# Patient Record
Sex: Male | Born: 1940 | Race: White | Hispanic: No | Marital: Married | Smoking: Former smoker
Health system: Southern US, Community
[De-identification: ages and names within clinical notes are randomized; demographics above are authoritative.]

## PROBLEM LIST (undated history)

## (undated) DIAGNOSIS — Z8673 Personal history of transient ischemic attack (TIA), and cerebral infarction without residual deficits: Secondary | ICD-10-CM

## (undated) DIAGNOSIS — E785 Hyperlipidemia, unspecified: Secondary | ICD-10-CM

## (undated) DIAGNOSIS — G4733 Obstructive sleep apnea (adult) (pediatric): Secondary | ICD-10-CM

## (undated) DIAGNOSIS — I10 Essential (primary) hypertension: Secondary | ICD-10-CM

## (undated) DIAGNOSIS — R0789 Other chest pain: Secondary | ICD-10-CM

## (undated) HISTORY — PX: THROAT SURGERY: SHX803

## (undated) HISTORY — PX: BACK SURGERY: SHX140

## (undated) HISTORY — DX: Personal history of transient ischemic attack (TIA), and cerebral infarction without residual deficits: Z86.73

## (undated) HISTORY — PX: SHOULDER SURGERY: SHX246

## (undated) HISTORY — PX: EYE SURGERY: SHX253

## (undated) HISTORY — PX: CARDIOVASCULAR STRESS TEST: SHX262

## (undated) HISTORY — DX: Essential (primary) hypertension: I10

## (undated) HISTORY — DX: Obstructive sleep apnea (adult) (pediatric): G47.33

## (undated) HISTORY — DX: Other chest pain: R07.89

## (undated) HISTORY — DX: Hyperlipidemia, unspecified: E78.5

---

## 1999-01-18 ENCOUNTER — Ambulatory Visit (HOSPITAL_COMMUNITY): Admission: RE | Admit: 1999-01-18 | Discharge: 1999-01-18 | Payer: Self-pay | Admitting: Specialist

## 1999-01-18 ENCOUNTER — Encounter: Payer: Self-pay | Admitting: Specialist

## 1999-02-15 ENCOUNTER — Encounter: Payer: Self-pay | Admitting: Specialist

## 1999-02-15 ENCOUNTER — Ambulatory Visit (HOSPITAL_COMMUNITY): Admission: RE | Admit: 1999-02-15 | Discharge: 1999-02-15 | Payer: Self-pay | Admitting: Specialist

## 1999-03-01 ENCOUNTER — Ambulatory Visit (HOSPITAL_COMMUNITY): Admission: AD | Admit: 1999-03-01 | Discharge: 1999-03-01 | Payer: Self-pay | Admitting: Specialist

## 1999-03-01 ENCOUNTER — Encounter: Payer: Self-pay | Admitting: Specialist

## 1999-03-15 ENCOUNTER — Ambulatory Visit (HOSPITAL_COMMUNITY): Admission: AD | Admit: 1999-03-15 | Discharge: 1999-03-15 | Payer: Self-pay | Admitting: Specialist

## 1999-03-15 ENCOUNTER — Encounter: Payer: Self-pay | Admitting: Specialist

## 1999-09-21 ENCOUNTER — Encounter: Payer: Self-pay | Admitting: Specialist

## 1999-09-28 ENCOUNTER — Encounter: Payer: Self-pay | Admitting: Specialist

## 1999-09-28 ENCOUNTER — Encounter (INDEPENDENT_AMBULATORY_CARE_PROVIDER_SITE_OTHER): Payer: Self-pay | Admitting: *Deleted

## 1999-09-28 ENCOUNTER — Inpatient Hospital Stay (HOSPITAL_COMMUNITY): Admission: RE | Admit: 1999-09-28 | Discharge: 1999-09-30 | Payer: Self-pay | Admitting: Specialist

## 2000-08-17 ENCOUNTER — Ambulatory Visit (HOSPITAL_BASED_OUTPATIENT_CLINIC_OR_DEPARTMENT_OTHER): Admission: RE | Admit: 2000-08-17 | Discharge: 2000-08-17 | Payer: Self-pay | Admitting: Internal Medicine

## 2002-01-21 ENCOUNTER — Ambulatory Visit (HOSPITAL_COMMUNITY): Admission: RE | Admit: 2002-01-21 | Discharge: 2002-01-21 | Payer: Self-pay | Admitting: Specialist

## 2002-01-23 ENCOUNTER — Encounter: Payer: Self-pay | Admitting: Specialist

## 2002-01-23 ENCOUNTER — Encounter: Admission: RE | Admit: 2002-01-23 | Discharge: 2002-01-23 | Payer: Self-pay | Admitting: Specialist

## 2004-04-21 ENCOUNTER — Encounter: Admission: RE | Admit: 2004-04-21 | Discharge: 2004-04-21 | Payer: Self-pay | Admitting: Internal Medicine

## 2004-07-14 ENCOUNTER — Ambulatory Visit (HOSPITAL_COMMUNITY): Admission: RE | Admit: 2004-07-14 | Discharge: 2004-07-14 | Payer: Self-pay | Admitting: Neurological Surgery

## 2004-08-09 ENCOUNTER — Inpatient Hospital Stay (HOSPITAL_COMMUNITY): Admission: RE | Admit: 2004-08-09 | Discharge: 2004-08-09 | Payer: Self-pay | Admitting: Neurological Surgery

## 2004-12-17 ENCOUNTER — Emergency Department (HOSPITAL_COMMUNITY): Admission: EM | Admit: 2004-12-17 | Discharge: 2004-12-17 | Payer: Self-pay | Admitting: Emergency Medicine

## 2005-05-15 ENCOUNTER — Observation Stay (HOSPITAL_COMMUNITY): Admission: EM | Admit: 2005-05-15 | Discharge: 2005-05-17 | Payer: Self-pay | Admitting: Emergency Medicine

## 2005-08-29 ENCOUNTER — Inpatient Hospital Stay (HOSPITAL_COMMUNITY): Admission: EM | Admit: 2005-08-29 | Discharge: 2005-09-01 | Payer: Self-pay | Admitting: Emergency Medicine

## 2005-08-30 ENCOUNTER — Encounter (INDEPENDENT_AMBULATORY_CARE_PROVIDER_SITE_OTHER): Payer: Self-pay | Admitting: Cardiology

## 2005-12-05 ENCOUNTER — Ambulatory Visit (HOSPITAL_COMMUNITY): Admission: RE | Admit: 2005-12-05 | Discharge: 2005-12-05 | Payer: Self-pay | Admitting: Internal Medicine

## 2006-12-28 ENCOUNTER — Encounter: Admission: RE | Admit: 2006-12-28 | Discharge: 2006-12-28 | Payer: Self-pay | Admitting: Internal Medicine

## 2008-04-10 HISTORY — PX: US ECHOCARDIOGRAPHY: HXRAD669

## 2009-06-08 IMAGING — RF DG ESOPHAGUS
13 of 14 series · 20 of 21 positions shown · non-contrast
Comparison: Plain film chest 05/15/2005. Abdomen ultrasound 12/05/2005.

DOUBLE-CONTRAST BARIUM  ESOPHAGRAM.

CLINICAL DATA: Epigastric pain.
TECHNIQUE: Standard double-contrast barium swallow was performed.

[Series 1: run · 1 of 1 slices shown (1 of 13)]
[im 1/1]
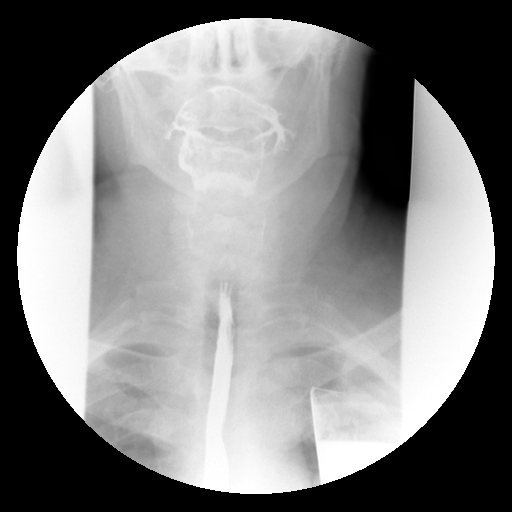

[Series 2: run · 6 of 6 slices shown (2 of 13)]
[im 1/6]
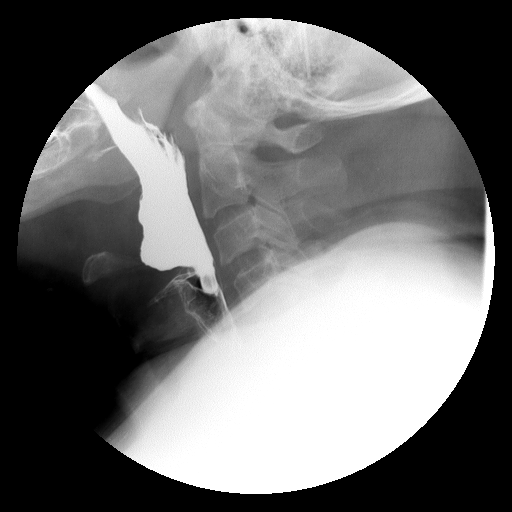
[im 2/6]
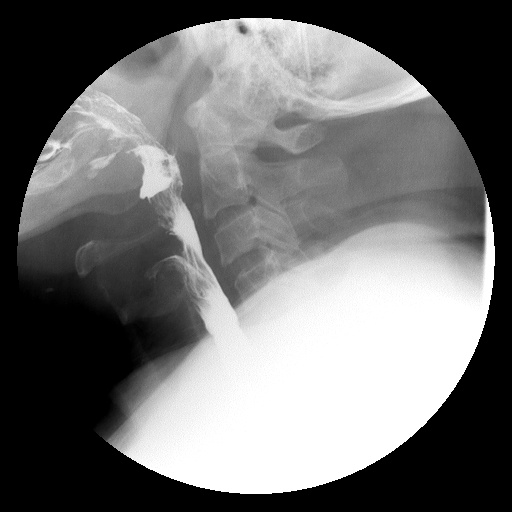
[im 3/6]
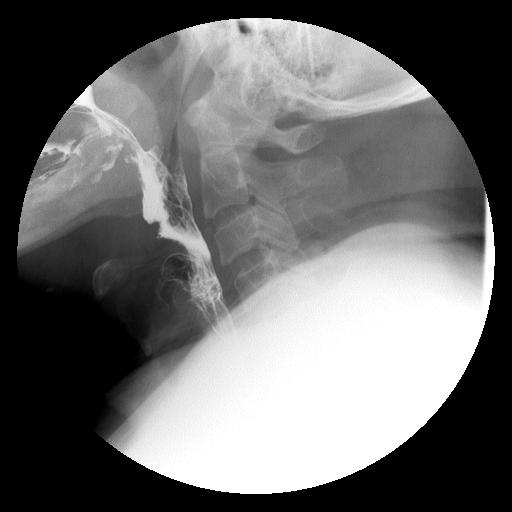
[im 4/6]
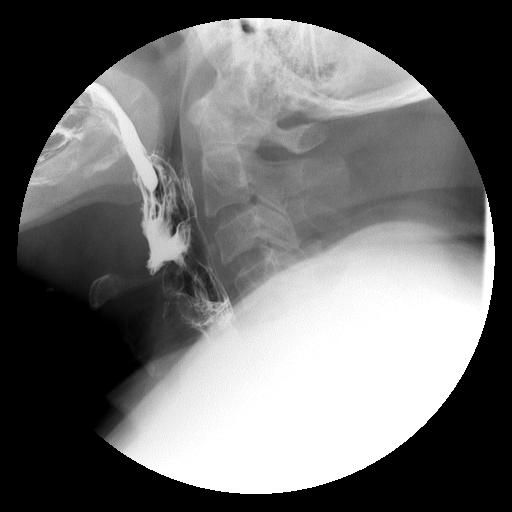
[im 5/6]
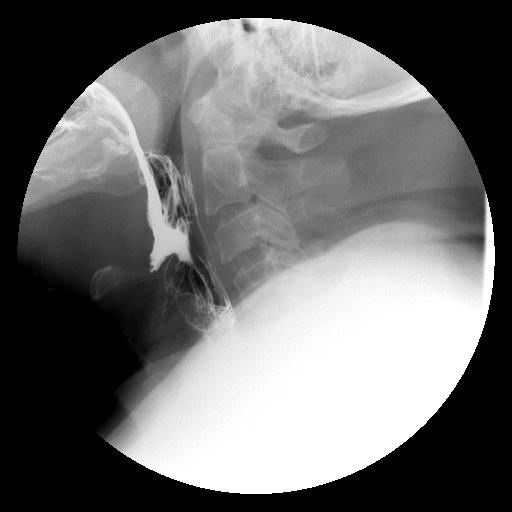
[im 6/6]
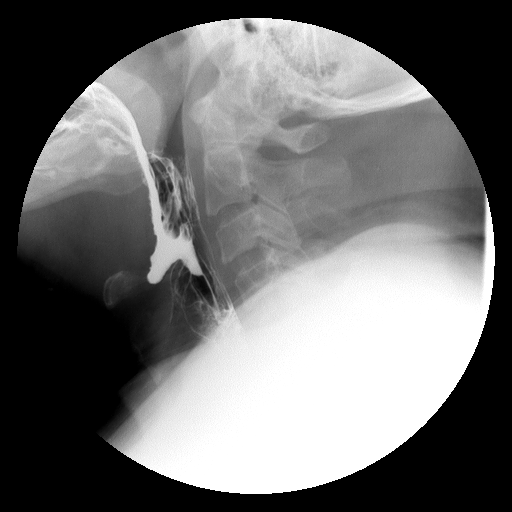

[Series 3: run · 1 of 1 slices shown (3 of 13)]
[im 1/1]
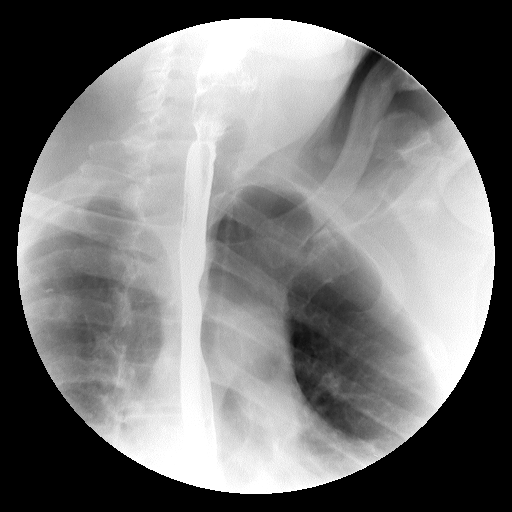

[Series 4: run · 1 of 1 slices shown (4 of 13)]
[im 1/1]
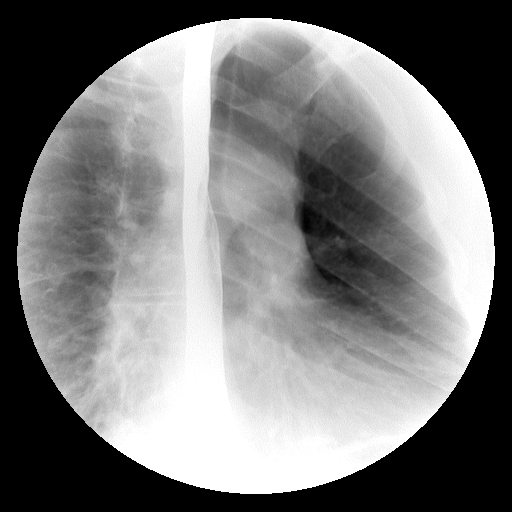

[Series 5: run · 1 of 1 slices shown (5 of 13)]
[im 1/1]
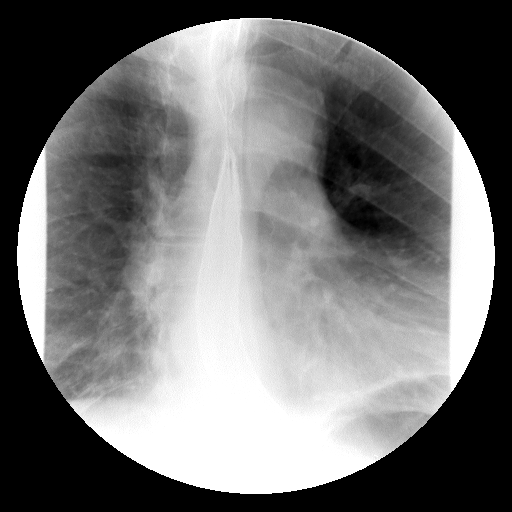

[Series 7: run · 1 of 1 slices shown (6 of 13)]
[im 1/1]
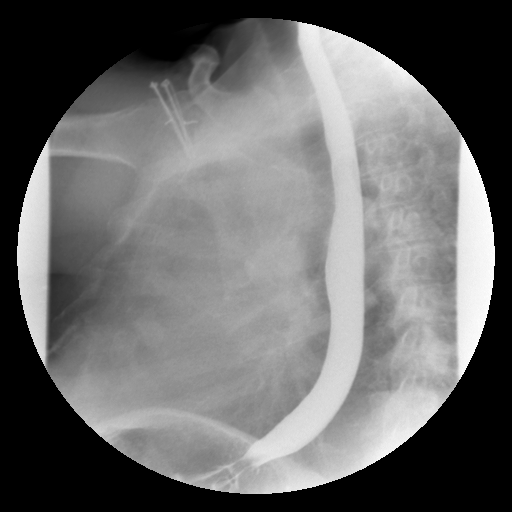

[Series 8: run · 1 of 1 slices shown (7 of 13)]
[im 1/1]
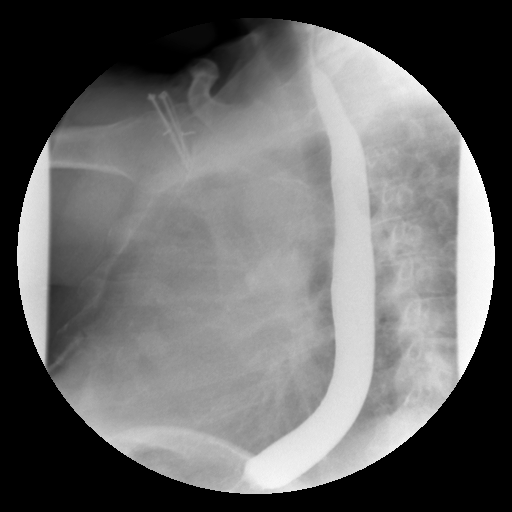

[Series 9: run · 1 of 1 slices shown (8 of 13)]
[im 1/1]
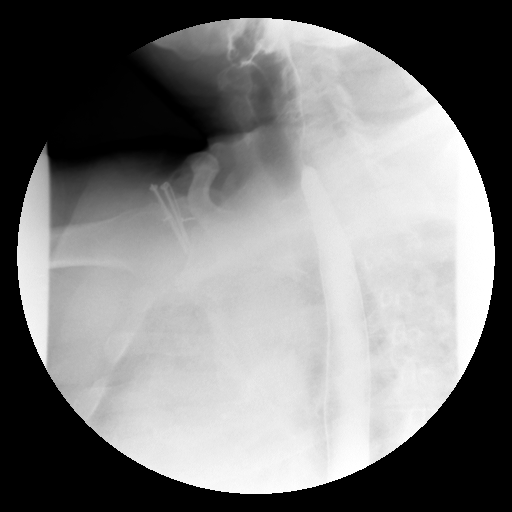

[Series 10: run · 1 of 1 slices shown (9 of 13)]
[im 1/1]
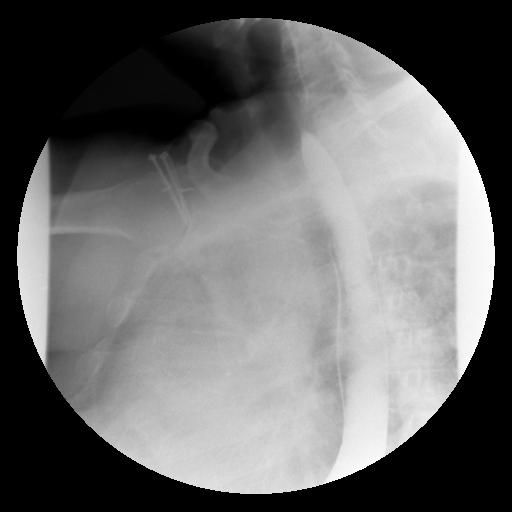

[Series 11: run · 1 of 1 slices shown (10 of 13)]
[im 1/1]
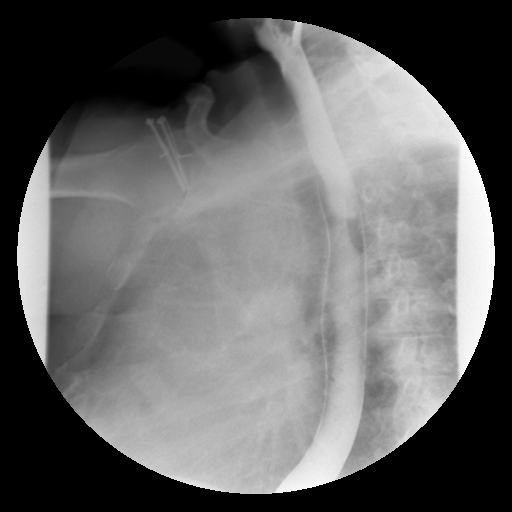

[Series 12: run · 1 of 1 slices shown (11 of 13)]
[im 1/1]
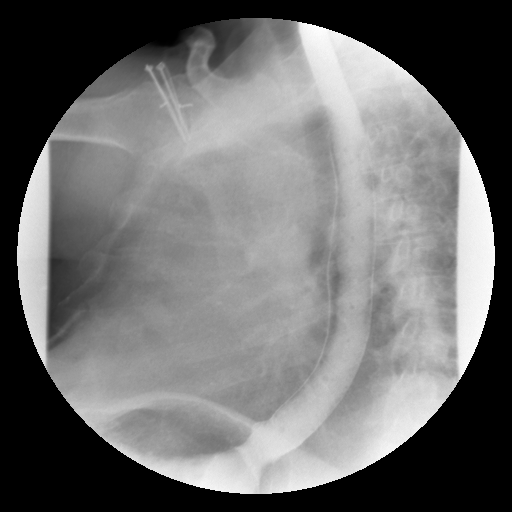

[Series 13: run · 3 of 3 slices shown (12 of 13)]
[im 1/3]
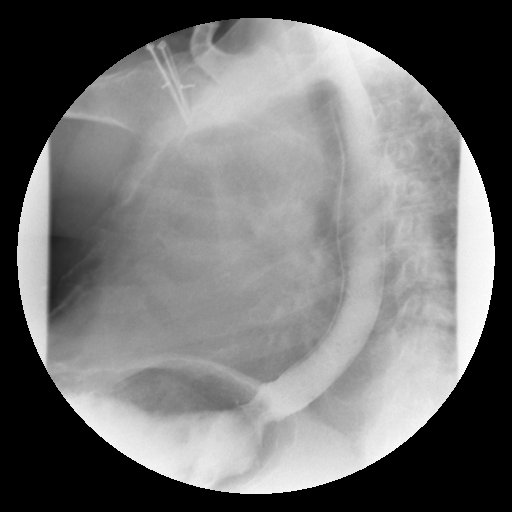
[im 2/3]
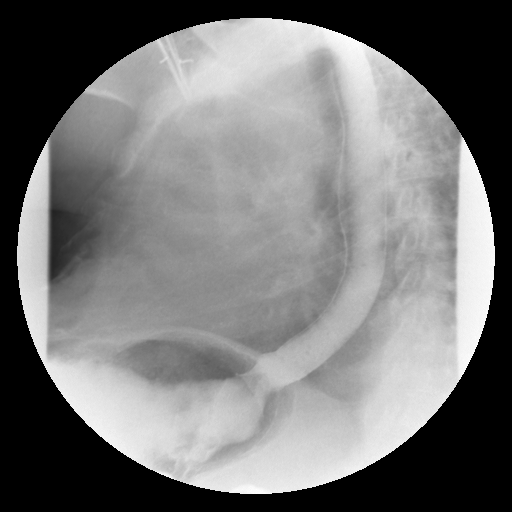
[im 3/3]
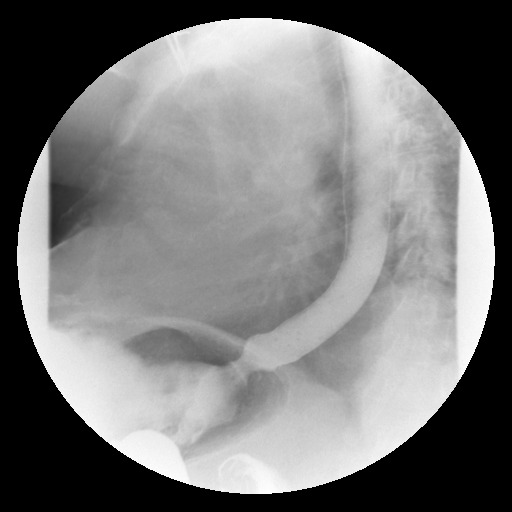

[Series 14: run · 1 of 1 slices shown (13 of 13)]
[im 1/1]
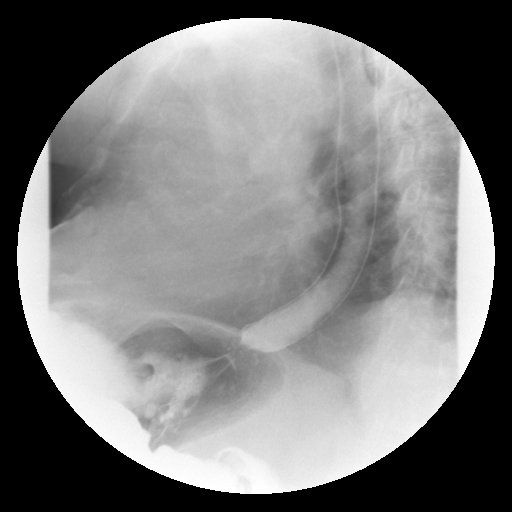

[20 of 21 positions shown; findings below may reference images not displayed]

FINDINGS: Hypopharyngeal portion of the exam demonstrates no significant findings.

Double-contrast evaluation of the esophagus demonstrates no mucosal abnormality.

Evaluation of primary peristalsis is normal.

Single contrast evaluation of the esophagus demonstrates no stricture or
significant narrowing.
IMPRESSION: 1. Normal esophagram as described.

## 2010-01-20 ENCOUNTER — Telehealth (INDEPENDENT_AMBULATORY_CARE_PROVIDER_SITE_OTHER): Payer: Self-pay | Admitting: *Deleted

## 2010-01-21 ENCOUNTER — Encounter (HOSPITAL_COMMUNITY): Admission: RE | Admit: 2010-01-21 | Discharge: 2010-03-11 | Payer: Self-pay | Admitting: Cardiology

## 2010-01-21 ENCOUNTER — Ambulatory Visit: Payer: Self-pay | Admitting: Internal Medicine

## 2010-01-21 ENCOUNTER — Encounter: Payer: Self-pay | Admitting: Cardiovascular Disease

## 2010-01-21 ENCOUNTER — Encounter: Payer: Self-pay | Admitting: Internal Medicine

## 2010-01-21 ENCOUNTER — Ambulatory Visit: Payer: Self-pay

## 2010-01-21 DIAGNOSIS — G459 Transient cerebral ischemic attack, unspecified: Secondary | ICD-10-CM | POA: Insufficient documentation

## 2010-01-22 ENCOUNTER — Encounter: Payer: Self-pay | Admitting: Cardiovascular Disease

## 2010-05-20 ENCOUNTER — Ambulatory Visit: Payer: Self-pay | Admitting: Vascular Surgery

## 2010-07-11 ENCOUNTER — Encounter (HOSPITAL_BASED_OUTPATIENT_CLINIC_OR_DEPARTMENT_OTHER): Payer: Self-pay | Admitting: Internal Medicine

## 2010-07-20 NOTE — Progress Notes (Signed)
Summary: Nuclear Pre Procedure  Phone Note Outgoing Call Call back at Home Phone [phone]   Call placed by: Rea [employer], CMA,  January 20, 2010 3:46 PM Call placed to: Patient Summary of Call: Reviewed information on Myoview Information Sheet (see scanned document for further details).  Spoke with patient.      Nuclear Med Background Indications for Stress Test: Evaluation for Ischemia   History: Echo, Myocardial Perfusion Study  History Comments: 3/07 Echo EF 75%, 7/07 MPS EF 62% No ischemia  Symptoms: Chest Pain, DOE, Fatigue    Nuclear Pre-Procedure Cardiac Risk Factors: CVA, Family History - CAD, Hypertension, Lipids, TIA

## 2010-07-20 NOTE — Assessment & Plan Note (Signed)
Summary: Cardiology Nuclear Testing  Nuclear Med Background Indications for Stress Test: Evaluation for Ischemia   History: Defibrillator, Echo, Myocardial Perfusion Study  History Comments: 3/07 Echo EF 75%, 7/07 MPS EF 62% No ischemia  Symptoms: Chest Pain, DOE, Fatigue    Nuclear Pre-Procedure Cardiac Risk Factors: CVA, Family History - CAD, Hypertension, Lipids, TIA Caffeine/Decaff Intake: None NPO After: 9:30 PM Lungs: clear IV 0.9% NS with Angio Cath: 22g     IV Site: (R) Wrist IV Started by: Irean Hong RN Chest Size (in) 44     Height (in): 65 Weight (lb): 199 BMI: 33.24 Tech Comments: The patient is claustrophobic, and took valium 10mg   by mouth  at 6:30 am today per orders by Dr. Deborah Chalk.  Nuclear Med Study 1 or 2 day study:  1 day     Stress Test Type:  Eugenie Birks Reading MD:  Dietrich Pates, MD     Referring MD:  S.Tennant Resting Radionuclide:  Technetium 71m Tetrofosmin     Resting Radionuclide Dose:  11.0 mCi  Stress Radionuclide:  Technetium 22m Tetrofosmin     Stress Radionuclide Dose:  33.0 mCi   Stress Protocol   Lexiscan: 0.4 mg        Nuclear Technologist:  Domenic Polite CNMT  Rest Procedure  Myocardial perfusion imaging was performed at rest 45 minutes following the intravenous administration of Myoview Technetium 20m Tetrofosmin.  Stress Procedure  The patient received IV Lexiscan 0.4 mg over 15-seconds.  Myoview injected at 30-seconds.  There were no significant changes with infusion.  Quantitative spect images were obtained after a 45 minute delay.  QPS Raw Data Images:  Images were motion corrected.  SOf t tissue (diaphragm, bowel activity) underlie heart. Stress Images:  There is normal uptake in all areas. Rest Images:  Normal homogeneous uptake in all areas of the myocardium. Subtraction (SDS):  No evidence of ischemia. Transient Ischemic Dilatation:  .96  (Normal <1.22)  Lung/Heart Ratio:  .34  (Normal <0.45)  Quantitative Gated Spect  Images QGS EDV:  70 ml QGS ESV:  23 ml QGS EF:  66 %   Overall Impression  Exercise Capacity: Lexiscan protocol. BP Response: Normal blood pressure response. Clinical Symptoms: No chest pain ECG Impression: No significant ST segment change suggestive of ischemia. Overall Impression: Normal stress nuclear study.

## 2010-07-20 NOTE — Miscellaneous (Signed)
Summary: Orders Update  Clinical Lists Changes  Problems: Added new problem of TIA (ICD-435.9) Orders: Added new Test order of Carotid Duplex (Carotid Duplex) - Signed

## 2010-07-22 ENCOUNTER — Encounter (INDEPENDENT_AMBULATORY_CARE_PROVIDER_SITE_OTHER): Payer: Self-pay | Admitting: *Deleted

## 2010-07-28 NOTE — Letter (Signed)
Summary: New Patient letter  [facility]  [address]   [place], [place] [address]   Phone: [phone]  Fax: [phone]       07/22/2010 MRN: [id number]  Sean Quinn [address] [place], [place]  [address]  Dear Mr. BASKETT,  Welcome to the Gastroenterology Division at [facility].    You are scheduled to see Dr.  Russella Dar on 08/16/2010 at 3:45 on the 3rd floor at [facility], [address]  We ask that you try to arrive at our office 15 minutes prior to your appointment time to allow for check-in.  We would like you to complete the enclosed self-administered evaluation form prior to your visit and bring it with you on the day of your appointment.  We will review it with you.  Also, please bring a complete list of all your medications or, if you prefer, bring the medication bottles and we will list them.  Please bring your insurance card so that we may make a copy of it.  If your insurance requires a referral to see a specialist, please bring your referral form from your primary care physician.  Co-payments are due at the time of your visit and may be paid by cash, check or credit card.     Your office visit will consist of a consult with your physician (includes a physical exam), any laboratory testing he/she may order, scheduling of any necessary diagnostic testing (e.g. x-ray, ultrasound, CT-scan), and scheduling of a procedure (e.g. Endoscopy, Colonoscopy) if required.  Please allow enough time on your schedule to allow for any/all of these possibilities.    If you cannot keep your appointment, please call [phone] to cancel or reschedule prior to your appointment date.  This allows [place] the opportunity to schedule an appointment for another patient in need of care.  If you do not cancel or reschedule by 5 p.m. the business day prior to your appointment date, you will be charged a $50.00 late cancellation/no-show fee.    Thank you for choosing [facility] for your medical needs.  We appreciate the opportunity to care for you.  Please visit [place] at our website  to learn more about our practice.                     Sincerely,                                                             The Gastroenterology Division

## 2010-08-16 ENCOUNTER — Ambulatory Visit: Payer: Self-pay | Admitting: Gastroenterology

## 2010-11-02 NOTE — Procedures (Signed)
CAROTID DUPLEX EXAM   INDICATION:  Carotid artery disease.   HISTORY:  Diabetes:  No  Cardiac:  No  Hypertension:  Hypertension  Smoking:  No  Previous Surgery:  No  CV History:  Dizziness, history of TIA with slurred speech  Amaurosis Fugax No, Paresthesias No, Hemiparesis No                                       RIGHT             LEFT  Brachial systolic pressure:         132               129  Brachial Doppler waveforms:         Normal            Normal  Vertebral direction of flow:        Antegrade         Antegrade  DUPLEX VELOCITIES (cm/sec)  CCA peak systolic                   108               113  ECA peak systolic                   125               72  ICA peak systolic                   84                53  ICA end diastolic                   25                18  PLAQUE MORPHOLOGY:                  Heterogeneous     Homogeneous  PLAQUE AMOUNT:                      Minimal           Minimal  PLAQUE LOCATION:                    CCA bifurcation   Bifurcation   IMPRESSION:  1. Right ICA velocities suggestive of a 1% to 39% stenosis.  2. Left ICA velocities suggestive of a 1% to 39% stenosis.          ___________________________________________  Janetta Hora Darrick Penna, MD   EM/MEDQ  D:  05/20/2010  T:  05/20/2010  Job:  [id number]

## 2010-11-02 NOTE — Assessment & Plan Note (Signed)
OFFICE VISIT   Sean Quinn, Sean Quinn  DOB:  Oct 25, 1940                                       05/20/2010  ZOXWR#:[id number]   CHIEF COMPLAINT:  Right leg pain.   HISTORY OF PRESENT ILLNESS:  The patient is 70 year old male referred by  Dr. Eloise Quinn for evaluation of lower extremity pain.  The patient states  that he has had pain in his right leg that occurs immediately upon  standing.  He states that it has been present for approximately 2 years  but worse over the last 6 months.  He states it starts hurting after  taking 1-2 steps.  Of note, he has had multiple back operations.  He has  also had a prior stroke.  There was some thinking from Dr. Eloise Quinn that  this may be neurogenic in nature.   The patient denies atherosclerotic risk factors of diabetes,  hypertension or elevated cholesterol.   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   SOCIAL HISTORY:  He is married.  He has two children.  He is retired.  He is a former smoker but quit in 1977.  He drinks 4-5 beers on some  days but some days does not drink at all.  He does not currently smoke.   FAMILY HISTORY:  Denies history of coronary artery disease or peripheral  arterial occlusive disease.   REVIEW OF SYSTEMS:  Full 12 point review of systems was performed on the  patient today.  Please see intake referral form for details regarding  this.   MEDICATIONS:  Include methazolamide, aspirin, lactulose, furosemide,  tramadol, ipratropium, omega 3 Fish Oil, vitamin D3, vitamin B12,  Writer.   ALLERGIES:  He has no known drug allergies.   PHYSICAL EXAM:  Vital signs:  Blood pressure is 129/81 in the left arm,  heart rate is 103 and regular, respirations 20.  HEENT:  Unremarkable.  Neck:  Has 2+ carotid pulses without bruit.  Chest:  Clear to  auscultation.  Cardiac:  Regular  rate and rhythm without murmur.  Abdomen:  Obese, soft, nontender, nondistended.  No masses.  Extremities:  He has 2+ femoral pulses bilaterally.  He has a 2+ left  popliteal pulse.  He has a 2+ left posterior tibial pulse.  He has  absent popliteal and pedal pulses on the right side.  Feet are pink,  warm and well-perfused bilaterally.  He has no ulcerations on the feet.  Skin:  Has no open ulcers or rashes elsewhere.  Neurologic:  Shows  symmetric upper extremity and lower extremity motor strength which is  5/5 and symmetric.  Musculoskeletal:  Shows no major obvious joint  deformities.   I reviewed his lower extremity arterial duplex from 04/06/2010 at  [facility].  This showed a normal ABI on the right side of 1.1.  He  also had a normal ABI on the left side of 1.07.  He did have monophasic  waveforms in the anterior tibial artery on both sides.  However, he had  triphasic normal waveforms in the posterior tibial artery in both lower  extremities.   In summary, the patient has evidence of mild tibial disease bilaterally  but he has normal ABIs.  He does  have some pain in his right leg with  walking.  However, I agree with Dr. 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  Since the patient had had a stroke in the past  and had evidence of previous ulcerated plaque in his carotid arteries we  did do a carotid duplex ultrasound today which showed no significant  stenosis bilaterally.   I believe the best option for the patient at this point would be a  walking program of walking 30 minutes daily whether this is neurogenic  or vascular claudication that he would benefit from this.  I have also  counseled him today on weight loss since he is obese and he could  benefit from weight loss to improve his lower extremity symptoms as  well.  I doubt that his symptoms currently are really related to lower   extremity arterial occlusive disease since he does have normal ABIs  bilaterally and has primarily disease in the anterior tibial artery  alone and this should be well compensated by the peroneal and posterior  tibial arteries.  He will follow up in six months' time for repeat ABIs  to make sure he has had no decline in his overall circulation.     Sean Hora. Fields, MD  Electronically Signed   CEF/MEDQ  D:  05/20/2010  T:  05/21/2010  Job:  [id number]   cc:   Sean Quinn, M.D.

## 2010-11-05 NOTE — Discharge Summary (Signed)
NAME:  Sean Quinn, Sean Quinn                ACCOUNT NO.:  [id number]   MEDICAL RECORD NO.:  [id number]          PATIENT TYPE:  INP   LOCATION:  2003                         FACILITY:  [facility]   PHYSICIAN:  Barry Dienes. Eloise Harman, M.D.DATE OF BIRTH:  [birth year]   DATE OF ADMISSION:  05/15/2005  DATE OF DISCHARGE:  05/17/2005                                 DISCHARGE SUMMARY   PERTINENT FINDINGS:  Patient is a 70 year old white male with a history of  hyperlipidemia and no history of hypertension.  He has a history of beer  consumption of six to seven beers per day and was feeling poorly on the one  to two days prior to admission with dizziness.  The dizziness gradually  worsened and was associated with diaphoresis and marked gait instability so  he presented to the [facility] Emergency Room for evaluation.  In the  emergency room a head CT scan without contrast showed old lacunar infarcts.  He was not able to have an MRI scan due to metal in his right eye.  He was  admitted for further evaluation.   PAST MEDICAL HISTORY:  1.  Hyperlipidemia.  2.  Osteoarthritis.  3.  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  4.  Several shoulder surgeries.  5.  Back surgeries x3.  6.  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   ALLERGIES:  No known drug allergies.   MEDICATIONS PRIOR TO ADMISSION:  1.  Celebrex 100 mg daily.  2.  Crestor 10 mg daily.   REVIEW OF SYSTEMS:  He denied worsening of his dizziness with head position  changes and no change in his vision.  He has had chronic mild dyspnea on  exertion and chronic low back pain.   PHYSICAL EXAMINATION:  VITAL SIGNS:  Temperature 97, blood pressure 153/92,  pulse 100, respirations 18, pulse oxygen saturation 98% on room air.  GENERAL:  He is a mildly overweight white male who is alert and oriented x3  and in no acute distress.  HEENT:  Significant for right eye enucleation.  Extraocular movements of the  left eye were normal.  Cranial  nerves II-XII were grossly intact.  NECK:  Without jugular venous distention or carotid bruit.  CHEST:  Clear to auscultation.  HEART:  Regular rate and rhythm without murmur or gallop.  ABDOMEN:  Benign.  EXTREMITIES:  Normal pedal pulses and no edema.  NEUROLOGIC:  He was alert and answered questions well.  He had reproduction  of his dizziness when he changed from supine position to sitting up.  With  attempts at walking he would stumble significantly.   LABORATORIES:  Troponin I less than 0.05.  Serum sodium 137, potassium 4.3,  chloride 109, CO2 22, BUN 43, creatinine 1, glucose 111, white blood cells  7.7, hemoglobin 15.3, platelets 92, alcohol level less than 5.  Liver tests  normal.  PTT 28.  EKG showed normal sinus rhythm.   HOSPITAL COURSE:  Patient was admitted to a telemetry medical bed for  further evaluation.  He showed no evidence of arrhythmia and serial cardiac  iso enzymes were normal.  He was started on aspirin and an ACE inhibitor for  his elevated blood pressure.  He was also started on meclizine.  A neurology  consultant evaluated him and felt that he could be having posterior  circulation TIAs so he recommended a carotid ultrasound examination and CT  angiogram study of the head and neck.  The CT angiogram of the head and neck  was significant for left proximal internal carotid artery 50% stenosis with  some distal ulceration, right internal carotid artery normal, left vertebral  artery ostium was 75-90% focal stenosis, and an aberrant right subclavian  artery.  The bilateral carotid ultrasound examination showed no significant  internal carotid artery stenoses.   Gradually, patient's symptoms improved a great deal.  He still had mild  dizziness with walking in his room.  He no longer had any nausea or vomiting  and has had no change in his vision.   PROCEDURES:  1.  November 26 CT scan of the head without contrast.  2.  November 26 chest x-ray with no acute  abnormalities.  3.  November 27 bilateral carotid ultrasound.  4.  November 27 CT angiogram of the head.  5.  November 27 CT angiogram of the neck.   COMPLICATIONS:  None.   CONDITION ON DISCHARGE:  He continues to have very mild dizziness when  walking, but is able to walk independently in his room.  He denies shortness  of breath at rest or palpitations or chest pain.  He has been eating well  with no nausea.  He denies a headache.  Most recent vital signs include  blood pressure 124/78, pulse 93, respirations 20, temperature 97.4, pulse  oxygen saturation 97% on room air.  He is a mildly overweight, elderly white  male who is in no apparent distress.  Chest is clear to auscultation.  Heart  had a regular rate and rhythm.  Abdomen was benign.  Extremities were  without edema.  With gait examination he was able to change from a sitting  position to a standing position and walk in his room with a turn-around  independently.   Most recent laboratory tests:  White blood cell count 7.7, hemoglobin 14,  hematocrit 42, platelets 87.  Serum sodium 137, potassium 3.7, chloride 105,  CO2 26, BUN 15, creatinine 1, glucose 95, hemoglobin A1c 5.3%.  Troponin I  0.02, 0.02.  Homocysteine 15.4 (normal 4-15.4).  Total cholesterol 189,  triglycerides 152, HDL cholesterol 46, LDL cholesterol 113.   DISCHARGE DIAGNOSES:  1.  Vertigo.  2.  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  3.  Hyperlipidemia.  4.  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  5.  Hypertension.  6.  Thrombocytopenia.  7.  History of moderate alcohol consumption.  8.  Exogenous obesity.   DISCHARGE MEDICATIONS:  1.  Aggrenox one cap p.o. daily for 14 days, then one cap twice daily.  2.  Crestor 10 mg daily.  3.  Tylenol 500 mg p.o. q.i.d. p.r.n. mild pain.  4.  Darvocet-N 100 one tablet p.o. t.i.d. p.r.n. moderate pain #90, refill      0. 5.  Meclizine 75 mg p.o. t.i.d. p.r.n. dizziness, #90.  6.   Lisinopril 5 mg p.o. daily (for high blood pressure).   SPECIAL INSTRUCTIONS:  He was advised to reduce or eliminate alcohol  consumption.  He was also advised to increase his activity slowly and to not  drive for one week following discharge.  He was instructed that initial  doses of Aggrenox could cause headaches.  He was advised to call Dr.  Eloise Harman if his headaches are severe or nausea occurs or his dizziness  worsens.   FOLLOW-UP PLANS:  He should see Dr. Eloise Harman at [facility]  in approximately one week following discharge and was advised to call [phone] to schedule that appointment.  In addition, he was advised to have a  follow-up visit with Dr. Delia Heady in approximately two months following  discharge and was advised to call [phone] to schedule that appointment.           ______________________________  Barry Dienes. Eloise Harman, M.D.     DGP/MEDQ  D:  05/17/2005  T:  05/17/2005  Job:  [id number]   cc:   Pramod P. Pearlean Brownie, MD  Fax: [phone]

## 2010-11-05 NOTE — H&P (Signed)
NAME:  Sean Quinn, CURCI                ACCOUNT NO.:  [id number]   MEDICAL RECORD NO.:  [id number]          PATIENT TYPE:  INP   LOCATION:  3041                         FACILITY:  [facility]   PHYSICIAN:  Casimiro Needle L. Reynolds, M.D.DATE OF BIRTH:  Feb 10, 1941   DATE OF ADMISSION:  08/29/2005  DATE OF DISCHARGE:                                HISTORY & PHYSICAL   CHIEF COMPLAINT:  Right arm weakness and speech disturbance.   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 Eloise Harman and was seen in consultation by Dr. Pearlean Brownie in the stroke  service.  The patient reports that he was at home working this evening at  about 4 p.m. when he noted sudden onset of weakness and clumsiness in the  right upper extremity.  He notes that he dropped something out of his right  hand and when he went to pick it up his hand was clumsy and was not working  well.  Shortly after that he noticed that his speech was disturbed and it  was hard for him to form words.  EMS was called and evaluated him.  He was  taken to [facility] and en route his symptoms cleared after lasting  for about 45 minutes.  He now feels well.  He says he has not had any recent  similar symptoms.   PAST MEDICAL HISTORY:  As above.  He had an extensive stroke work-up at that  time including CT angiogram and carotid transcranial Dopplers as well as  stroke laboratories.  Of note, he cannot have an MRI due to the present  metal in his right eye.  He was placed on Aggrenox and says his primary  physician has continued that due to headaches and he has been on aspirin  since that time.  He also has history of hypertension,  high cholesterol,  osteoarthritis with chronic low back problems.   FAMILY HISTORY:  Remarkable for stroke and CHF.   SOCIAL HISTORY:  He lives with his wife.  He is normally independent in his  activities of daily living.  He denies tobacco use.  He has cut down to four  to five beers a day from six to seven at his last visit.   ALLERGIES:  No known drug allergies.   MEDICATIONS:  1.  Aspirin.  2.  Crestor 10 mg daily.  3.  Diovan 80 mg daily (started last week in place of lisinopril).  4.  Tylenol six a day.   REVIEW OF SYSTEMS:  Continues with chronic low back pain as noted above.  He  denies any chest pain, shortness of breath, palpitations, headache, nausea,  or vomiting.  Full 10-systems review of systems is negative except as  outlined in the HPI and in the  ER and admission nursing record.   PHYSICAL EXAMINATION:  VITAL SIGNS:  Temperature 98.2, blood pressure  148/85, pulse 96, respirations 20.  GENERAL:  This is an obese but otherwise healthy-appearing man seen in no  evident distress.  HEENT:  Head:  Cranium is normocephalic, atraumatic.  Oropharynx benign.  NECK:  Supple without carotid or supraclavicular bruits.  HEART:  Regular rate and rhythm without murmurs.  CHEST:  Clear to auscultation bilaterally.  ABDOMEN:  Soft.  Normoactive bowel sounds.  EXTREMITIES:  No edema.  2+ pulses.  NEUROLOGIC:  Pupils are equal and reactive.  Extraocular movements full  without nystagmus.  Face, tongue, and palate move normally and  symmetrically.  Motor:  Normal bulk and tone.  Normal strength in all tested  extremity muscles.  Sensation intact light touch in all extremities.  Coordination:  Rapid movements performed well.  Finger-nose performed well.  Gait:  He rises easily from a chair.  His stance is normal.  He is able to  ambulate without difficulty and stand with a narrow base without difficulty.  Reflexes 2+ and symmetric.  Toes are downgoing bilaterally.    LABORATORIES:  CBC:  White count 8.8, hemoglobin 14.2, platelets 134,000.  Chemistries are normal.  Coags are normal.  EKG is normal.   IMPRESSION:  Left brain transient ischemic attack with transient right  hemiparesis and speech disturbance in a patient with a previous presumed  posterior circulation stroke and risk factors including hypertension,  hypercholesterolemia.  Of note, the episode occurred on aspirin and the  patient has been intolerant to Aggrenox in the past.   PLAN:  Will admit for observation.  Will recheck CT angiogram and carotid  transcranial Dopplers mostly to evaluate for any progression of left carotid  stenosis which is found to be about 50% at his last admission.  Will also  check an echocardiogram as this was not done on his last admission and  recheck stroke laboratories.  For now will place him on Plavix.  Dr. Pearlean Brownie  and the stroke service will assume his care in the morning.      Michael L. Thad Ranger, M.D.  Electronically Signed     MLR/MEDQ  D:  08/29/2005  T:  08/30/2005  Job:  [id number]   cc:   Barry Dienes. Eloise Harman, M.D.  Fax: [phone]

## 2010-11-05 NOTE — Consult Note (Signed)
NAME:  Sean Quinn, Sean Quinn                ACCOUNT NO.:  [id number]   MEDICAL RECORD NO.:  [id number]          PATIENT TYPE:  OBV   LOCATION:  2003                         FACILITY:  [facility]   PHYSICIAN:  Pramod P. Pearlean Brownie, MD    DATE OF BIRTH:  1940/09/10   DATE OF CONSULTATION:  05/15/2005  DATE OF DISCHARGE:                                   CONSULTATION   REFERRING PHYSICIAN:  Creola Corn, MD   REASON FOR REFERRAL:  Dizziness.   HISTORY OF PRESENT ILLNESS:  Sean Quinn is a 70 year old Caucasian male who  developed the sudden onset of dizziness when he woke up from sleep on Friday  night.  He had the feeling of imbalance as well as associated severe  sweating sensation and nausea.  It lasted for an hour, then he went back to  sleep and when he woke up Saturday morning, he felt fine.  He had an  uneventful day and went to sleep and woke up again at midnight on Saturday  night with unsteadiness, nausea and sweating.  He had some pressure on his  head, but denied severe headache, vertigo, focal extremity weakness,  numbness, gait or balance difficulty, or any vision problems.  He has no  known prior history of TIA or stroke; however, CT scan of the head on  admission has been shown to have some remote basal ganglia infarcts.  The  patient has a longstanding history of degenerative cervical spine disease;  he has undergone 3 back surgeries by Dr. Danielle Dess.  He also has some spinal  stenosis in his neck and surgery for that has been pending.  The patient  denies any recent fall injury to the neck or any shooting pain down his  spine or his arms.   PAST MEDICAL HISTORY:  Past medical history is significant for:  1.  Hyperlipidemia.  2.  Obesity.  3.  Osteoarthritis.  4.  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   PAST SURGICAL HISTORY:  1.  Back surgery x3.  2.  Shoulder surgery 2-3 times.   HOME MEDICATIONS:  Crestor and Celebrex.   MEDICATION ALLERGIES:  None.   SOCIAL HISTORY:  The patient is retired.  He is on disability.  He does not  smoke and he has 6-7 beers every night.   FAMILY HISTORY:  Family history is significant for stroke in his father.   REVIEW OF SYSTEMS:  Review of systems is negative for chest pain, cough,  shortness of breath or diarrhea, is positive for neck pain and gait  difficulties, shortness of breath.   PHYSICAL EXAMINATION:  GENERAL:  Physical exam reveals an obese Caucasian  middle-age male who is not in distress.  VITAL SIGNS:  Afebrile.  Pulse rate is 90 per minute and regular.  Blood  pressure 153/92.  Respiratory rate 18 per minute.  SAT is 98% on room air.  PERIPHERAL VASCULAR:  Distal pulses are well-felt.  HEAD:  Nontraumatic.  NECK:  Neck is supple without bruit.  ENT:  Unremarkable.  CARDIAC:  Normal, no murmur or  gallop.  LUNGS:  Clear to auscultation.  NEUROLOGICAL:  The patient is pleasant, awake, alert and cooperative.  There  is no aphasia, apraxia or dysarthria.  He is blind in the right eye, which  is an old finding.  He has some disconjugate eye movements on the right.  There is no nystagmus noted.  Face is symmetric.  Bilateral movements are  normal.  Tongue is midline.  Motor system exam reveals no upper extremity  drift.  Symmetric strength and tone in all 4 extremities.  There is no focal  extremity weakness.  Deep tendon reflexes are 3+ and brisk throughout.  Plantars are downgoing.  Tone is slightly increased in the legs, but there  is no clonus noted.  The patient has mild truncal ataxia when he sits up and  he sways to either side.  There is no finger-to-nose or __________  ataxia  noted.  Gait was not tested.   DATA REVIEWED:  CT scan of the head per Dr. Ferd Hibbs notes shows no acute  findings, and a remote-age basal ganglia infarct.  I was unable to find the  CT scan films or the report in the computer.   CT scan of the spine dated January 2006 reveals mild degenerative changes  with  small central disk protrusions at C3-4, C4-5 and moderate protrusion to  the left at C5-C5.   Admission labs significant for slightly elevated glucose of 111 and lot  platelet count of 92,000, which is an old finding per the patient.  Coagulation labs are normal.  LFTs are normal.  Alcohol level is less than  5.   IMPRESSION:  [age]-year-old gentleman with sudden onset of dizziness  and nausea, and gait ataxia likely secondary to small cerebellar infarct.  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   PLAN:  I would recommend repeat CT scan of the head with limited contrast as  well as CT angiogram tomorrow.  Check  out transcranial Doppler studies,  fasting lipid profile, hemoglobin A1c and homocysteine levels.  Physical and  Occupational Therapy consults.  If the above workup is negative, we may  consider repeating CT scan of the C-spine to look for any significant  progression of his disease.   Thank you for the referral.  I look forward to seeing him in followup on  consults.  Kindly call for questions.           ______________________________  Sunny Schlein. Pearlean Brownie, MD     PPS/MEDQ  D:  05/15/2005  T:  05/16/2005  Job:  [id number]

## 2010-11-05 NOTE — Op Note (Signed)
NAME:  AKSHAJ, BESANCON                ACCOUNT NO.:  [id number]   MEDICAL RECORD NO.:  [id number]          PATIENT TYPE:  INP   LOCATION:  2899                         FACILITY:  [facility]   PHYSICIAN:  Stefani Dama, M.D.  DATE OF BIRTH:  18-Sep-1940   DATE OF PROCEDURE:  08/09/2004  DATE OF DISCHARGE:                                 OPERATIVE REPORT   PREOPERATIVE DIAGNOSIS:  Lumbar spondylosis, lumbar stenosis L4-L5  degenerative listhesis L4-L5.   POSTOPERATIVE DIAGNOSIS:  Lumbar spondylosis, lumbar stenosis L4-L5  degenerative listhesis L4-L5.   PROCEDURE:  Lumbar decompression with X stop device L4-L5.   SURGEON:  Stefani Dama, M.D.   ANESTHESIA:  General endotracheal.   INDICATIONS:  Mr. Vittorio is a 70 year old individual who has had significant  back pain with bilateral leg pain worse on the right than on the left. He  has degenerative spondylolisthesis at the L4-L5 level with stenosis at that  level.  He has moderate spondylosis elsewhere in his lumbar spine but  clearly the singular worst level  was at had L4-L5.  He has been advised  regarding trial of decompression using an X stop device.   PROCEDURE:  The patient was brought to the operating room supine on the  stretcher. After smooth induction of general tracheal anesthesia, he was  turned prone.  The back was shaved, prepped with DuraPrep, draped in sterile  fashion. Midline incision was created over the L4-L5 interspace which was  localized using fluoroscopic guidance. The skin, subcutaneous tissues and  muscle was injected with 18 mL of lidocaine with epinephrine mixed with 0.5%  Marcaine.  The dissection was then taken out over the spinous process and a  submuscular dissection was performed in the interlaminar space at L4-L5.  The area was packed off.  The interspinous space was then placed first by  using the small dilator in the interspinous space and then the dilator was  enlarged to the second stage of  dilation and then the interspinous expander  was placed in the interspinous process. This was then dilated out under  fluoroscopy to a 14 mm size and a fair amount of tension was noted in the  superior intraspinous ligament.  It was decided that a 12 mm spacer would be  placed.  The dilator was left in position for a period of about three to  four minutes to allow for some ligamental taxis and then a 12 mm X stop was  placed into the interspinous space at L4-L5.  Soft tissues overlying the  outer edge of the X stop were cleared and then the opposite wing of the X  stop was placed. This was then placed under modest compression with a Museum/gallery conservator and a Kocher clamp and then the wing was tightened into its final  position. The wound was copiously irrigated with antibiotic irrigating  solution. The AP and lateral fluoroscopic radiographs were obtained. The  wound was closed with 2-0 Vicryl in the lumbodorsal fascia and 3-0 Vicryl  was used in the subcuticular tissue and Dermabond was placed on the skin.  The patient tolerated procedure well and was returned to recovery room.    HJE/MEDQ  D:  08/09/2004  T:  08/09/2004  Job:  [id number]

## 2010-11-05 NOTE — H&P (Signed)
NAME:  Sean Quinn, Sean Quinn NO.:  [id number]   MEDICAL RECORD NO.:  [id number]          PATIENT TYPE:  OBV   LOCATION:  1827                         FACILITY:  [facility]   PHYSICIAN:  Gwen Pounds, MD       DATE OF BIRTH:  [birth year]   DATE OF ADMISSION:  05/15/2005  DATE OF DISCHARGE:                                HISTORY & PHYSICAL   NEUROSURGEON:  Stefani Dama, M.D.   CHIEF COMPLAINT:  Intractable vertigo and dizzy.   HISTORY OF PRESENT ILLNESS:  This 70 year old with hyperlipidemia had never  been diagnosed with hypertension, but blood pressure here was very high.  He  is a six to seven beer per day drinker who started feeling poor on Friday  night, with dizziness which lasted on hour.  He woke up with sweats, he  tried to change positions, and it finally went away.  He went through  yesterday okay without issue.  He drank his beers as usual.  Because of back  issues, he sleeps on the sofa, and he went to sleep on the sofa last night  around 7:30 or 8 o'clock p.m.  He woke up with dizziness, sweating,  stumbling, loss of coordination at about 12:30, and this time it did not go  away.  He woke up his wife and came to the emergency department.  Cranial CT  showed old lacunes.  Can not do an MRI due to metal in the right eye.  Called for admission.  No other signs or symptoms noted.   PAST MEDICAL HISTORY:  1.  Hyperlipidemia.  2.  Osteoarthritis.  3.  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  4.  Two to three shoulder surgeries.  5.  Back surgery x3.  6.  Cervical disk disease being followed by Dr. Danielle Dess.   ALLERGIES:  NO KNOWN DRUG ALLERGIES.   MEDICATIONS:  1.  Celebrex 100 daily.  2.  Crestor 10 daily.   SOCIAL HISTORY:  He is disabled.  He is retired.  No tobacco.  He drinks six  to seven beers per day.   FAMILY HISTORY:  Congestive heart failure in old age, and stroke.   REVIEW OF SYSTEMS:  He denies that he is an alcoholic.  He has no  history of  withdrawal.  No recent neck issues.  I have not heard any popping.  No  nausea, no vomiting.  No positional symptoms.  No visual symptoms.  He has  been short of breath x3 years.  He has chronic back pain.  He thought he was  going to get sick in the car but has not had any other nausea.  All other  organ symptoms were reviewed and are negative.   PHYSICAL EXAMINATION:  VITAL SIGNS:  Temperature 97.0, blood pressure  153/92, heart rate 100, respiratory rate 18, saturation 98% on room air.  GENERAL:  Alert and oriented x3.  No acute distress.  HEENT:  Right eye is enucleated secondary to steel in the eye.  Extraocular  movements intact.  Cranial nerves II-XII grossly  intact, except for the  right eye is blind.  NECK:  No JVD, no bruit.  PULMONARY:  Clear to auscultation bilaterally.  CARDIAC:  Regular.  ABDOMEN:  Soft, nontender, nondistended.  Bowel sounds are positive.  He is  very obese.  EXTREMITIES:  No edema.  Dorsalis pedal pulses are 1+ bilaterally.  DTRs at  the knees and ankles 1+ to 2+ bilaterally and equal.  Strength is in the  lower extremity and upper extremity is appropriate.  NEUROLOGIC:  Strength is appropriate.  He gets dizzy when he sits up.  Neurologic is intact, but he is not able to currently walk without  stumbling.  Laminectomy scar is noted on the lower back.  There is no gross  neurologic deficit.   ANCILLARY DATA:  CK-MB is 1.8.  Troponin I less than 0.05.  Myoglobin 149.  Sodium 137, potassium 4.3, chloride 109, bicarb 22, BUN 43, creatinine 1.0,  glucose 111.  White blood cell count 7.7, hemoglobin 15.3, platelet count  92.  Alcohol level less than 5 according to our scale.  LFTs are within  normal limits.  INR 0.9, PTT 28.   Cranial CT with no acute intracranial abnormality, atrophy, remote basal  ganglia lacunes, mild chronic sinusitis.  EKG shows normal sinus rhythm.   ASSESSMENT:  This is a 70 year old male with intractable dizziness,  vertigo,  unable to walk without stumbling, with cranial computed tomography scan  showing old lacunes.  He is a five to seven beer drinker per day.  He does  have elevated blood pressure, without a formal diagnosis of hypertension.  The symptoms in this man may be concerning for posterior circulation-type  symptoms.   PLAN:  1.  Admit for 23-hour observation.  2.  Telemetry.  3.  Start aspirin.  4.  Start ACE inhibitor for the elevated blood pressure, low-dose captopril.  5.  Continue statins.  6.  Continue meclizine.  7.  Will discuss with neurology about angiogram versus Doppler studies      versus cranial CT scan with CT angiogram.  8.  He needs to cut back or quit alcohol completely.  He needs to loose      weight and live more healthy, and we need to watch for withdrawal.      Gwen Pounds, MD  Electronically Signed     JMR/MEDQ  D:  05/15/2005  T:  05/15/2005  Job:  [id number]   cc:   Barry Dienes. Eloise Harman, M.D.  Fax: [phone]   Stefani Dama, M.D.  Fax: [phone]

## 2010-11-05 NOTE — Discharge Summary (Signed)
NAME:  Sean Quinn                ACCOUNT NO.:  [id number]   MEDICAL RECORD NO.:  [id number]          PATIENT TYPE:  INP   LOCATION:  3041                         FACILITY:  [facility]   PHYSICIAN:  Pramod P. Pearlean Brownie, MD    DATE OF BIRTH:  1941/04/28   DATE OF ADMISSION:  08/29/2005  DATE OF DISCHARGE:  09/01/2005                                 DISCHARGE SUMMARY   DISCHARGE DIAGNOSIS:  1.  Posterior circulation transient ischemic attack likely secondary to      atherosclerotic disease.  2.  Left vertebral artery origin stenosis, 50%.  3.  Left supraclinoid internal carotid artery stenosis at 50%.  4.  Ulcerative lesion left internal carotid artery in the bulb.  5.  Posterior circulation stroke in November 2006 with resolved dizziness      and gait disorder.  6.  Metal in his right eye.  7.  Hypertension.  8.  Hypercholesterolemia.  9.  Osteoarthritis.  10. Chronic low back pain.  11. Alcohol use, 4-5 beers a day.   DISCHARGE MEDICATIONS:  Aspirin 325 mg daily, Plavix 75 mg daily, Diovan 80  mg daily, Crestor 10 mg daily, Tylenol 1000 mg b.i.d., glucosamine  chondroitin 1500, two a day.   STUDIES PERFORMED:  1.  2D echocardiogram shows EF of 75%, inadequate to evaluate left      ventricular wall function, increased atrial contraction secondary to      ventricular filling, cannot exclude patent foramen ovale on basis of      study, no obvious embolic source.  2.  Transcranial Doppler was suboptimal.  3.  Carotid Doppler was normal.  4.  Cerebral angiogram performed by Dr. Corliss Skains showed 50% stenosis left      vertebral artery at the origin, 50% left ICA stenosis in the      supraclinoid region, large ulcerative plaque in the left ICA carotid      bulb with no associated stenosis or filling defect, aberrant origin      right subclavian artery.  5.  EKG shows normal sinus rhythm.   LABORATORY DATA:  CBC with platelets 134, otherwise, normal, differential  normal.  Coagulation  studies normal.  Chemistries normal.  Liver function  tests normal.  Hemoglobin A1C 5.5, homocystine 16, cholesterol 127,  triglycerides 79, HDL 25, LDL 66.  Urinalysis is normal.   HISTORY OF PRESENT ILLNESS:  Mr. Sean Quinn is a 70 year old right handed  white male who has a history of posterior circulation stroke in November  2006 with resolved dizziness and gait disorder, for which he was admitted to  the hospital by Dr. Eloise Harman and seen in consultation by Dr. Pearlean Brownie.  The  patient reports he was at home working the evening of admission around 4  p.m. when he noted sudden onset of weakness and clumsiness in the right  upper extremity.  He notes he dropped something out of his right hand and  when he went to pick it up, his hand was clumsy and not working.  Shortly  after that, he noted his speech was disturbed and it was hard  for him to  form words.  EMS was called and the patient was [facility] to [facility]  Emergency Room.  His symptoms resolved after lasting about 45 minutes.  He  was admitted to the hospital for further stroke evaluation.   HOSPITAL COURSE:  During the patient's November 2006 admission, he had a CT  angiogram because he could not have an MRI due to the presence of metal in  his right eye.  He was placed on Aggrenox for secondary stroke prevention  which was discontinued secondary to headaches and he was placed on aspirin  only.  MRI this admission also could not be done secondary to the presence  of metal in his eye.  CT angiogram was considered, but instead, a cerebral  angiogram was performed to get the most accurate results.  It was felt his  left sided stenosis may potentially be worse and carotid Doppler showed no  abnormal stenosis, and a CT angiogram showed no change in stenosis, but some  ulceration in the left ICA.  Due to ulceration, it was felt the patient  needed to be changed to aspirin and Plavix together for a period of one  month at which time  aspirin will be discontinued and Plavix, alone, would be  continued.  His neurological status improved and he had no focal deficits at  the time of discharge.  Again recommended were strict control of his  vascular risk factors including blood pressure which ran in the 130s to 140s  over 80s during hospitalization and continued cholesterol control.  The  patient has decreased alcohol intake over the past several months from 6-7  beers a day to now 4-5, we would like for him to continue down to two beers  per day.  This was discussed with the patient and his daughter.   CONDITION ON DISCHARGE:  The patient is alert and oriented x 3.  No aphasia  or dysarthria.  No cranial nerve deficits.  No focal neurologic deficits.   DISCHARGE PLAN:  1.  Discharge home with family.  2.  Aspirin and Plavix together x one month, then Plavix only.  3.  Follow up with Dr. Jarome Matin within one month of discharge.  4.  Follow up with Dr. Pearlean Brownie in 2-3 months.      Annie Main, N.P.    ______________________________  Sunny Schlein. Pearlean Brownie, MD    SB/MEDQ  D:  09/01/2005  T:  09/02/2005  Job:  [id number]   cc:   Barry Dienes. Eloise Harman, M.D.  Fax: [phone]   Sanjeev K. Corliss Skains, M.D.  Fax: [phone]

## 2010-11-29 ENCOUNTER — Telehealth: Payer: Self-pay | Admitting: Cardiology

## 2010-11-29 NOTE — Telephone Encounter (Signed)
RETURNING KELLY'S CALL from Friday.  Patient's wife thinks Tresa Endo was calling about changing providers.

## 2010-11-29 NOTE — Telephone Encounter (Signed)
RN left message for pt to call back if he would like RN to set him up with a new cardiologist.

## 2011-01-04 ENCOUNTER — Encounter: Payer: Self-pay | Admitting: Cardiovascular Disease

## 2011-01-07 ENCOUNTER — Encounter: Payer: Self-pay | Admitting: Cardiology

## 2011-01-10 ENCOUNTER — Ambulatory Visit (INDEPENDENT_AMBULATORY_CARE_PROVIDER_SITE_OTHER): Payer: Medicare Other | Admitting: Cardiovascular Disease

## 2011-01-10 ENCOUNTER — Encounter: Payer: Self-pay | Admitting: Cardiovascular Disease

## 2011-01-10 VITALS — BP 110/74 | HR 80 | Ht 65.0 in | Wt 193.0 lb

## 2011-01-10 DIAGNOSIS — I5189 Other ill-defined heart diseases: Secondary | ICD-10-CM

## 2011-01-10 DIAGNOSIS — I519 Heart disease, unspecified: Secondary | ICD-10-CM

## 2011-01-10 NOTE — Progress Notes (Signed)
Sean Quinn Date of Birth  02/29/1941 [facility] / [facility] [address]     [address] [place], [place]  [address] [phone]  Fax  [phone]  History of Present Illness:  Sean Quinn is a 70 year old gentleman who is a previous patient of Dr. Reyes Ivan. He's been seen in the interim by Dr. Deborah Chalk. He has a history of diastolic dysfunction, obesity, dyslipidemia, and a history of strokes. He's done well since his last visit. He's not had any episodes of chest pain or shortness of breath. He had a normal stress test in 2007 which revealed no ischemia and revealed normal left ventricular systolic function.  He has a history of obstructive sleep apnea but does not like to wear this weakness. Apparently he has claustrophobia and does not tolerate it very well.    Current Outpatient Prescriptions  Medication Sig Dispense Refill  . aspirin 81 MG tablet Take 81 mg by mouth daily.        Marland Kitchen atenolol (TENORMIN) 25 MG tablet Take 25 mg by mouth daily.        . Cholecalciferol (VITAMIN D3) 1000 UNITS CAPS Take by mouth daily.        . Cyanocobalamin (VITAMIN B-12 PO) Take 500 mcg by mouth.        . fish oil-omega-3 fatty acids 1000 MG capsule Take 2 g by mouth daily.        . furosemide (LASIX) 20 MG tablet Take 20 mg by mouth daily.        Marland Kitchen ipratropium (ATROVENT) 0.03 % nasal spray Place 2 sprays into the nose every 12 (twelve) hours.        Marland Kitchen lactulose (CHRONULAC) 10 GM/15ML solution Take 20 g by mouth at bedtime.        . Multiple Vitamins-Minerals ([employer]S SPECTRAVITE PO) Take by mouth.        Marland Kitchen omeprazole (PRILOSEC) 20 MG capsule Take 20 mg by mouth daily.        . traMADol (ULTRAM) 50 MG tablet Take 50 mg by mouth every 6 (six) hours as needed.          No Known Allergies  Past Medical History  Diagnosis Date  . Chest discomfort   . History of TIAs   . Hyperlipidemia   . Obstructive sleep apnea     Past Surgical History  Procedure Date  . Shoulder surgery      X2  . Back surgery     X3  . US echocardiography 04/10/2008    EF 55-60%  . Cardiovascular stress test     EF 75%    History  Smoking status  . Former Smoker  . Quit date: 01/03/1981  Smokeless tobacco  . Not on file    History  Alcohol Use No    Family History  Problem Relation Age of Onset  . Heart attack Mother   . Heart failure Father     Reviw of Systems:  Reviewed in the HPI.  All other systems are negative.  Physical Exam: BP 110/74  Pulse 80  Ht 5\' 5"  (1.651 m)  Wt 193 lb (87.544 kg)  BMI 32.12 kg/m2 The patient is alert and oriented x 3.  The mood and affect are normal.   Skin: warm and dry.  Color is normal.    HEENT:   the sclera are nonicteric.  The mucous membranes are moist.  The carotids are 2+ without bruits.  There is no thyromegaly.  There is no JVD.    Lungs: clear.  The chest wall is non tender.    Heart: regular rate with a normal S1 and S2.  There are no murmurs, gallops, or rubs. The PMI is not displaced.     Abdomen: good bowel sounds.  There is no guarding or rebound.  There is no hepatosplenomegaly or tenderness.  There are no masses.   Extremities:  no clubbing, cyanosis, or edema.  The legs are without rashes.  The distal pulses are intact.   Neuro:  Cranial nerves II - XII are intact.  Motor and sensory functions are intact.    The gait is normal.  ECG: NSR.   Old inferior wall myocardial infarction  Assessment / Plan:

## 2011-01-10 NOTE — Assessment & Plan Note (Signed)
Sean Quinn seems to be fairly stable from a cardiac standpoint. He's not having any significant shortness of breath. I've encouraged him to exercise on the right the basis of an effort to lose some weight.  His EKG today is reveals some very small Q waves. It may just be available and EKG. He's not had any symptoms consistent with myocardial infarction. We will continue to follow him clinically.

## 2011-01-11 ENCOUNTER — Encounter: Payer: Self-pay | Admitting: Cardiovascular Disease

## 2011-01-19 ENCOUNTER — Other Ambulatory Visit: Payer: Self-pay | Admitting: Emergency Medicine

## 2011-01-19 DIAGNOSIS — J929 Pleural plaque without asbestos: Secondary | ICD-10-CM

## 2011-07-28 ENCOUNTER — Other Ambulatory Visit: Payer: Self-pay | Admitting: Cardiovascular Disease

## 2011-07-28 MED ORDER — FUROSEMIDE 20 MG PO TABS: 20.0000 mg | ORAL_TABLET | Freq: Every day | ORAL | Status: DC

## 2011-07-28 NOTE — Telephone Encounter (Signed)
Pt is out of pills 

## 2012-01-18 ENCOUNTER — Other Ambulatory Visit: Payer: Self-pay | Admitting: Cardiovascular Disease

## 2012-01-19 NOTE — Telephone Encounter (Signed)
Spoke to patient and he states he did not want to see Dr. Elease Hashimoto due to him not having longer time in the room with him and feeling like he's being rushed. Pt is aware of new location and is aware he has to make an appointment with Dr. Elease Hashimoto then refill can be made. Fax Received. Refill Completed. Delphina Schum Chowoe (R.M.A).

## 2012-02-23 ENCOUNTER — Other Ambulatory Visit: Payer: Self-pay | Admitting: Cardiology

## 2012-02-23 MED ORDER — FUROSEMIDE 20 MG PO TABS: 20.0000 mg | ORAL_TABLET | Freq: Every day | ORAL | Status: DC

## 2012-04-13 ENCOUNTER — Encounter: Payer: Self-pay | Admitting: Cardiovascular Disease

## 2012-04-13 ENCOUNTER — Ambulatory Visit (INDEPENDENT_AMBULATORY_CARE_PROVIDER_SITE_OTHER): Payer: Medicare Other | Admitting: Cardiovascular Disease

## 2012-04-13 VITALS — BP 103/68 | HR 74 | Resp 18 | Ht 65.0 in | Wt 195.8 lb

## 2012-04-13 DIAGNOSIS — R0600 Dyspnea, unspecified: Secondary | ICD-10-CM | POA: Insufficient documentation

## 2012-04-13 DIAGNOSIS — R0609 Other forms of dyspnea: Secondary | ICD-10-CM

## 2012-04-13 DIAGNOSIS — I5189 Other ill-defined heart diseases: Secondary | ICD-10-CM

## 2012-04-13 DIAGNOSIS — I519 Heart disease, unspecified: Secondary | ICD-10-CM

## 2012-04-13 DIAGNOSIS — G459 Transient cerebral ischemic attack, unspecified: Secondary | ICD-10-CM

## 2012-04-13 DIAGNOSIS — E785 Hyperlipidemia, unspecified: Secondary | ICD-10-CM

## 2012-04-13 LAB — BASIC METABOLIC PANEL
CO2: 29 mEq/L (ref 19–32)
Calcium: 9.1 mg/dL (ref 8.4–10.5)
Chloride: 102 mEq/L (ref 96–112)
Glucose, Bld: 104 mg/dL — ABNORMAL HIGH (ref 70–99)
Sodium: 136 mEq/L (ref 135–145)

## 2012-04-13 LAB — HEPATIC FUNCTION PANEL
ALT: 18 U/L (ref 0–53)
Albumin: 3.6 g/dL (ref 3.5–5.2)
Alkaline Phosphatase: 45 U/L (ref 39–117)
Bilirubin, Direct: 0 mg/dL (ref 0.0–0.3)
Total Protein: 7.1 g/dL (ref 6.0–8.3)

## 2012-04-13 LAB — LIPID PANEL
HDL: 32 mg/dL — ABNORMAL LOW (ref >39.00)
Triglycerides: 179 mg/dL — ABNORMAL HIGH (ref 0.0–149.0)

## 2012-04-13 NOTE — Progress Notes (Signed)
Sean Quinn Date of Birth  1940/11/21 [facility] / [facility] [address]     [address] [place], [place]  [address] [phone]  Fax  [phone]   Problems: 1. Diastolic dysfunction 2. CVA 3. bstructive sleep apnea - did not tolerate CPAP 4. Hyperlipidemia 5,  History of Present Illness:  Sean Quinn is a 71 year old gentleman who is a previous patient of Dr. Reyes Ivan. He's been seen in the interim by Dr. Deborah Chalk. He has a history of diastolic dysfunction, obesity, dyslipidemia, and a history of strokes. He's done well since his last visit. He's not had any episodes of chest pain or shortness of breath. He had a normal stress test in 2007 which revealed no ischemia and revealed normal left ventricular systolic function.  He has a history of obstructive sleep apnea but does not like to wear this weakness. Apparently he has claustrophobia and does not tolerate it very well.  He has continued to have dsypnea.      Current Outpatient Prescriptions  Medication Sig Dispense Refill  . aspirin 81 MG tablet Take 81 mg by mouth daily.        Marland Kitchen atenolol (TENORMIN) 25 MG tablet Take 25 mg by mouth daily.        . Cholecalciferol (VITAMIN D3) 1000 UNITS CAPS Take by mouth daily.        . Cyanocobalamin (VITAMIN B-12 PO) Take 500 mcg by mouth.        . fish oil-omega-3 fatty acids 1000 MG capsule Take 2 g by mouth daily.        . furosemide (LASIX) 20 MG tablet Take 1 tablet (20 mg total) by mouth daily.  30 tablet  4  . lactulose (CHRONULAC) 10 GM/15ML solution Take 20 g by mouth at bedtime.        . Multiple Vitamins-Minerals (CVS SPECTRAVITE PO) Take by mouth.        Marland Kitchen omeprazole (PRILOSEC) 20 MG capsule Take 20 mg by mouth daily.        . traMADol (ULTRAM) 50 MG tablet Take 50 mg by mouth every 6 (six) hours as needed.          No Known Allergies  Past Medical History  Diagnosis Date  . Chest discomfort   . History of TIAs   . Hyperlipidemia   .  Obstructive sleep apnea     Past Surgical History  Procedure Date  . Shoulder surgery     X2  . Back surgery     X3  . US echocardiography 04/10/2008    EF 55-60%  . Cardiovascular stress test     EF 75%    History  Smoking status  . Former Smoker  . Quit date: 01/03/1981  Smokeless tobacco  . Not on file    History  Alcohol Use No    Family History  Problem Relation Age of Onset  . Heart attack Mother   . Heart failure Father     Reviw of Systems:  Reviewed in the HPI.  All other systems are negative.  Physical Exam: BP 103/68  Pulse 74  Resp 18  Ht 5\' 5"  (1.651 m)  Wt 195 lb 12.8 oz (88.814 kg)  BMI 32.58 kg/m2  SpO2 92% The patient is alert and oriented x 3.  The mood and affect are normal.   Skin: warm and dry.  Color is normal.    HEENT:   the sclera are nonicteric.  The mucous membranes  are moist.  The carotids are 2+ without bruits.  There is no thyromegaly.  There is no JVD.    Lungs: clear.  The chest wall is non tender.    Heart: regular rate with a normal S1 and S2.  There are no murmurs, gallops, or rubs. The PMI is not displaced.     Abdomen: good bowel sounds.  There is no guarding or rebound.  There is no hepatosplenomegaly or tenderness.  There are no masses.   Extremities:  no clubbing, cyanosis, or edema.  The legs are without rashes.  The distal pulses are intact.   Neuro:  Cranial nerves II - XII are intact.  Motor and sensory functions are intact.    The gait is normal.  ECG: Oct. 25, 2013 - normal sinus rhythm at 71 beats a minute. He has no ST or T wave changes to  Assessment / Plan:

## 2012-04-13 NOTE — Patient Instructions (Addendum)
Your physician wants you to follow-up in: 1 YEAR You will receive a reminder letter in the mail two months in advance. If you don't receive a letter, please call our office to schedule the follow-up appointment.   Your physician recommends that you return for lab work in: TODAY   Your physician recommends that you continue on your current medications as directed. Please refer to the Current Medication list given to you today.   

## 2012-04-13 NOTE — Assessment & Plan Note (Signed)
Mr. Arendt presents today for episodes of continued dyspnea. He has multiple reasons to be just dyspneic. He is overweight. He has sleep apnea but doesn't wear his CPAP mask. He has some degree of diastolic dysfunction.  I have encouraged him to try to exercise on regular basis. He should work on a good diet and exercise and weight loss program. I've encouraged him to check back in with his medical doctor and be fitted with a CPAP mask that suits him.  I'll see him again in one year for followup visit and labs. We'll get labs as well today.

## 2012-04-20 ENCOUNTER — Encounter: Payer: Self-pay | Admitting: *Deleted

## 2012-04-23 ENCOUNTER — Encounter: Payer: Self-pay | Admitting: *Deleted

## 2012-07-19 ENCOUNTER — Telehealth: Payer: Self-pay | Admitting: Cardiovascular Disease

## 2012-07-19 MED ORDER — FUROSEMIDE 20 MG PO TABS: 20.0000 mg | ORAL_TABLET | Freq: Every day | ORAL | Status: DC

## 2012-07-19 NOTE — Telephone Encounter (Signed)
Fax Received. Refill Completed. Sean Quinn (R.M.A)   

## 2012-07-19 NOTE — Telephone Encounter (Signed)
Pt needs refill furosimide, pt said pharmacy requested twice , pt now out, uses [facility]

## 2012-12-20 ENCOUNTER — Other Ambulatory Visit: Payer: Self-pay | Admitting: *Deleted

## 2012-12-20 MED ORDER — FUROSEMIDE 20 MG PO TABS: 20.0000 mg | ORAL_TABLET | Freq: Every day | ORAL | Status: DC

## 2012-12-20 NOTE — Telephone Encounter (Signed)
Fax Received. Refill Completed. Sean Quinn (R.M.A)  NEED APPOINTMENT 

## 2013-04-12 ENCOUNTER — Encounter: Payer: Self-pay | Admitting: Cardiovascular Disease

## 2013-04-15 ENCOUNTER — Other Ambulatory Visit: Payer: Self-pay | Admitting: Cardiovascular Disease

## 2013-04-23 ENCOUNTER — Ambulatory Visit (INDEPENDENT_AMBULATORY_CARE_PROVIDER_SITE_OTHER): Payer: Medicare Other | Admitting: Cardiovascular Disease

## 2013-04-23 ENCOUNTER — Encounter: Payer: Self-pay | Admitting: Cardiovascular Disease

## 2013-04-23 VITALS — BP 116/73 | HR 83 | Ht 65.0 in | Wt 197.6 lb

## 2013-04-23 DIAGNOSIS — I5189 Other ill-defined heart diseases: Secondary | ICD-10-CM

## 2013-04-23 DIAGNOSIS — I509 Heart failure, unspecified: Secondary | ICD-10-CM

## 2013-04-23 DIAGNOSIS — I519 Heart disease, unspecified: Secondary | ICD-10-CM

## 2013-04-23 DIAGNOSIS — I5032 Chronic diastolic (congestive) heart failure: Secondary | ICD-10-CM

## 2013-04-23 DIAGNOSIS — Z79899 Other long term (current) drug therapy: Secondary | ICD-10-CM

## 2013-04-23 LAB — BASIC METABOLIC PANEL
BUN: 14 mg/dL (ref 6–23)
Calcium: 9.1 mg/dL (ref 8.4–10.5)
Chloride: 101 mEq/L (ref 96–112)
Creatinine, Ser: 1 mg/dL (ref 0.4–1.5)
GFR: 80.5 mL/min (ref >60.00)

## 2013-04-23 NOTE — Assessment & Plan Note (Signed)
Sean Quinn seems to be doing well. He does not get out and do much. He is limited by his arthritis.  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  We'll continue with his same medications.

## 2013-04-23 NOTE — Patient Instructions (Signed)
Your physician recommends that you return for lab work in: bmet today  Your physician wants you to follow-up in: 1 year w/ lab draw You will receive a reminder letter in the mail two months in advance. If you don't receive a letter, please call our office to schedule the follow-up appointment.

## 2013-04-23 NOTE — Progress Notes (Signed)
Sean Quinn Date of Birth  1941-03-29 [facility] / [facility] [address]     [address] [place], [place]  [address] [phone]  Fax  [phone]   Problems: 1. Diastolic dysfunction 2. CVA 3. bstructive sleep apnea - did not tolerate CPAP 4. Hyperlipidemia 5,  History of Present Illness:  Sean Quinn is a 72 year old gentleman who is a previous patient of Dr. Reyes Ivan. He's been seen in the interim by Dr. Deborah Chalk. He has a history of diastolic dysfunction, obesity, dyslipidemia, and a history of strokes. He's done well since his last visit. He's not had any episodes of chest pain or shortness of breath. He had a normal stress test in 2007 which revealed no ischemia and revealed normal left ventricular systolic function.  He has a history of obstructive sleep apnea but does not like to wear this weakness. Apparently he has claustrophobia and does not tolerate it very well.  He has continued to have dsypnea.  Nov. 4, 2014:    Complains of arthritis.  Not getting any exercise.  Works on [employer].   Unable to walk far due to arthritis.  Has back pain that radiates down to his right leg.   no CP , no dyspnea.   Current Outpatient Prescriptions  Medication Sig Dispense Refill  . aspirin 81 MG tablet Take 81 mg by mouth daily.        Marland Kitchen atenolol (TENORMIN) 25 MG tablet Take 25 mg by mouth daily.        . Cholecalciferol (VITAMIN D3) 1000 UNITS CAPS Take by mouth daily.        . Cyanocobalamin (VITAMIN B-12 PO) Take 500 mcg by mouth.        . fish oil-omega-3 fatty acids 1000 MG capsule Take 2 g by mouth daily.        . furosemide (LASIX) 20 MG tablet TAKE 1 TABLET BY MOUTH DAILY.  30 tablet  0  . lactulose (CHRONULAC) 10 GM/15ML solution Take 20 g by mouth at bedtime.        . Multiple Vitamins-Minerals (CVS SPECTRAVITE PO) Take by mouth.        Marland Kitchen omeprazole (PRILOSEC) 20 MG capsule Take 20 mg by mouth daily.        . traMADol (ULTRAM) 50 MG tablet Take 50  mg by mouth every 6 (six) hours as needed.         No current facility-administered medications for this visit.    No Known Allergies  Past Medical History  Diagnosis Date  . Chest discomfort   . History of TIAs   . Hyperlipidemia   . Obstructive sleep apnea     Past Surgical History  Procedure Laterality Date  . Shoulder surgery      X2  . Back surgery      X3  . US echocardiography  04/10/2008    EF 55-60%  . Cardiovascular stress test      EF 75%    History  Smoking status  . Former Smoker  . Quit date: 01/03/1981  Smokeless tobacco  . Not on file    History  Alcohol Use No    Family History  Problem Relation Age of Onset  . Heart attack Mother   . Heart failure Father     Reviw of Systems:  Reviewed in the HPI.  All other systems are negative.  Physical Exam: BP 116/73  Pulse 83  Ht 5\' 5"  (1.651 m)  Wt 197  lb 9.6 oz (89.631 kg)  BMI 32.88 kg/m2 The patient is alert and oriented x 3.  The mood and affect are normal.   Skin: warm and dry.  Color is normal.    HEENT:   the sclera are nonicteric.  The mucous membranes are moist.  The carotids are 2+ without bruits.  There is no thyromegaly.  There is no JVD.    Lungs: clear.  The chest wall is non tender.    Heart: regular rate with a normal S1 and S2.  There are no murmurs, gallops, or rubs. The PMI is not displaced.     Abdomen: moderately obese abdomen.  good bowel sounds.  There is no guarding or rebound.  There is no hepatosplenomegaly or tenderness.  There are no masses.   Extremities:  no clubbing, cyanosis, or edema.  The legs are without rashes.  The distal pulses are intact.   Neuro:  Cranial nerves II - XII are intact.  Motor and sensory functions are intact.    The gait is normal.  ECG: Nov. 4, 2014:  NSR at 73. Normal ECG   Assessment / Plan:

## 2013-05-20 ENCOUNTER — Other Ambulatory Visit: Payer: Self-pay | Admitting: Cardiovascular Disease

## 2014-04-02 ENCOUNTER — Institutional Professional Consult (permissible substitution): Payer: Medicare Other | Admitting: Neurology

## 2014-04-16 ENCOUNTER — Encounter: Payer: Self-pay | Admitting: Cardiology

## 2014-04-17 ENCOUNTER — Encounter: Payer: Self-pay | Admitting: Neurology

## 2014-04-17 ENCOUNTER — Ambulatory Visit (INDEPENDENT_AMBULATORY_CARE_PROVIDER_SITE_OTHER): Payer: Medicare HMO | Admitting: Neurology

## 2014-04-17 VITALS — BP 116/67 | HR 90 | Temp 98.5°F | Resp 14 | Ht 65.75 in | Wt 205.0 lb

## 2014-04-17 DIAGNOSIS — R0601 Orthopnea: Secondary | ICD-10-CM

## 2014-04-17 DIAGNOSIS — G4733 Obstructive sleep apnea (adult) (pediatric): Secondary | ICD-10-CM

## 2014-04-17 DIAGNOSIS — R0602 Shortness of breath: Secondary | ICD-10-CM

## 2014-04-17 DIAGNOSIS — Z87891 Personal history of nicotine dependence: Secondary | ICD-10-CM

## 2014-04-17 NOTE — Progress Notes (Signed)
SLEEP MEDICINE CLINIC   Provider:  Melvyn Novasarmen  Alexius Ellington, M D  Referring Provider: Jarome MatinPaterson, Daniel, MD Primary Care Physician:  Sean Quinn,Sean G, MD  Chief Complaint  Patient presents with  . NP Sleep , Dr.  Eloise Quinn    Rm 10, wife    HPI:  Sean Quinn is a 73 y.o. male , seen here as a referral from Dr. Eloise Quinn for an evaluation of sleep apnea.  Mr. Sean Quinn reported recently to his primary care physician Dr. Eloise Quinn that he had increasing shortness of breath and diaphoresis at night he also coughs more. He has chronic dyspnea. The patient is seen here today to evaluate him for sleep apnea. It appears that he was last tested for 20 years ago in [place].  Sean Quinn.  At the time he was placed on the Full Facemask and  could not tolerate this. He considers himself claustrophobic . I Showed Him Today Some of the contemporary  nasal pillow masks and He is much more likely to use this.  Due To His His Chronic Hypoxemia He Is Used To Oxygen Being Given in a Nasal Cannula and has tolerated this rather well.  Dr. Eloise Quinn recently Ordered an Overnight Pulse Oximetry of which the results were inconclusive .   Mrs. Sean Quinn reports that her husband has been sleeping in a recliner on the couch since 1998  ,due to orthopnea and difficulties brief and supine sleep position.He was injured in 1998 when a 18 pounds heavy piece of steel dropped on him.  He is naturally avoiding supine sleep. He is often sleepy right after dinner. He does not go to the bedroom. He is in pain, legs, shoulder and back.  He is restless. He is always short of breath  and had bouts of pneumonia.  He sleeps whenever he sits in front of the TV- that's all afternoon and evening.     The Patient Is Retired from the [employer], He Is married, Right-Handed . He quit smoking in the 1980s. He is drinking beer daily.  The patient reported the 1960s he developed first diplopia after a work related accident and  swollen after lost progressively vision.  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         Review of Systems: Out of a complete 14 system review, the patient complains of only the following symptoms, and all other reviewed systems are negative.  SOB, sleepy in chronic pain.   Epworth score  11 points-with daily naps. , Fatigue severity score 56  ,  Geriatric depression score 8 points    History   Social History  . Marital Status: Married    Spouse Name: N/A    Number of Children: N/A  . Years of Education: N/A   Occupational History  . Not on file.   Social History Main Topics  . Smoking status: Former Smoker    Quit date: 01/03/1981  . Smokeless tobacco: Not on file  . Alcohol Use: Yes     Comment: 5-6 beers daily   . Drug Use: No  . Sexual Activity: Not on file   Other Topics Concern  . Not on file   Social History Narrative  . No narrative on file    Family History  Problem Relation Age of Onset  . Heart attack Mother   . Heart failure Father   . Diabetes Brother     Past Medical History  Diagnosis  Date  . Chest discomfort   . History of TIAs   . Hyperlipidemia   . Obstructive sleep apnea   . Hypertension     Past Surgical History  Procedure Laterality Date  . Shoulder surgery      X2  . Back surgery      X3  . [place] echocardiography  04/10/2008    EF 55-60%  . Cardiovascular stress test      EF 75%  . Eye surgery Right     Metal in eye  . Throat surgery      nasal and throat surgery for snoring / apnea    Current Outpatient Prescriptions  Medication Sig Dispense Refill  . aspirin 81 MG tablet Take 81 mg by mouth daily.        Marland Kitchen. atenolol (TENORMIN) 25 MG tablet Take 25 mg by mouth daily.        . Cholecalciferol (VITAMIN D3) 1000 UNITS CAPS Take by mouth daily.        . Cyanocobalamin (VITAMIN B-12 PO) Take 500 mcg by mouth.        . fish oil-omega-3 fatty acids 1000 MG  capsule Take 2 g by mouth daily.        . furosemide (LASIX) 20 MG tablet TAKE 1 TABLET BY MOUTH DAILY.  30 tablet  10  . lactulose (CHRONULAC) 10 GM/15ML solution Take 20 g by mouth at bedtime.        . Multiple Vitamins-Minerals (CVS SPECTRAVITE PO) Take by mouth.        Marland Kitchen. omeprazole (PRILOSEC) 20 MG capsule Take 20 mg by mouth daily.        . traMADol (ULTRAM) 50 MG tablet Take 50 mg by mouth every 6 (six) hours as needed.         No current facility-administered medications for this visit.    Allergies as of 04/17/2014  . (No Known Allergies)    Vitals: BP 116/67  Pulse 90  Temp(Src) 98.5 F (36.9 C) (Oral)  Resp 14  Ht 5' 5.75" (1.67 m)  Wt 205 lb (92.987 kg)  BMI 33.34 kg/m2 Last Weight:  Wt Readings from Last 1 Encounters:  04/17/14 205 lb (92.987 kg)       Last Height:   Ht Readings from Last 1 Encounters:  04/17/14 5' 5.75" (1.67 m)    Physical exam:  General: The patient is awake, alert and appears not in acute distress. The patient is well groomed. Head: Normocephalic, atraumatic. Neck is supple. Mallampati 4-5 and neck is 19 inches wide.   neck circumference . Nasal airflow unrestricted - SOB causes him to breath through the mouth. , TMJ is  Not evident . Retrognathia is seen.  Cardiovascular:  Regular rate and rhythm, without  murmurs or carotid bruit, and without distended neck veins. Respiratory: Lungs are clear to auscultation. Skin:  Without evidence of edema, or rash Trunk: BMI elevated and patient  has normal posture.  Neurologic exam : The patient is awake and alert, oriented to place and time.    There is a normal attention span & concentration ability. Speech is fluent without   dysarthria, dysphonia or aphasia. 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  Facial sensation intact to fine touch.  Facial motor strength is symmetric, uvula not visible.   Motor exam:   Normal tone ,muscle bulk and symmetric - arthritis related  pain and ROM limitation, not muscle weakness. strength in all extremities. Gait and station: Patient walks without assistive device and is able unassisted to climb up to the exam table.  He became SOB during minimal exertion. He noted tremor in the right, dominant hand when walking.  Strength within normal limits. Deep tendon reflexes: in the  upper and lower extremities are symmetric and intact.   Assessment:  After physical and neurologic examination, review of laboratory studies, imaging, neurophysiology testing and pre-existing records, assessment is   1)  Hypersomnia, extreme fatigue, Patient with very high risk of OSA, high Mallampati and neck circumference.       Dysphonia and dysphagia. Needs CO2 measured, needs oxygen measured closely.       AHI 15 and Score at 4 % . 2)  He is certainly  At risk for an underlying obstructive  pulmonary disease.  3)  He has chronic pain and is a poor sleeper, will need a wedge or recliner to to sleep in.  4)  He needs to take a pain pill ( Tramadol )  every night, needs to bring it to the lab.   The patient was advised of the nature of the diagnosed sleep disorder , the treatment options and risks for general a health and wellness arising from not treating the condition. Visit duration was 45 minutes.   Plan:  Treatment plan and additional workup : Split as above. HUMANA .     Porfirio Mylar Shyam Dawson MD  04/17/2014

## 2014-04-17 NOTE — Patient Instructions (Signed)
polysomn  Polysomnography (Sleep Studies) Polysomnography (PSG) is a series of tests used for detecting (diagnosing) obstructive sleep apnea and other sleep disorders. The tests measure how some parts of your body are working while you are sleeping. The tests are extensive and expensive. They are done in a sleep lab or hospital, and vary from center to center. Your caregiver may perform other more simple sleep studies and questionnaires before doing more complete and involved testing. Testing may not be covered by insurance. Some of these tests are:  An EEG (Electroencephalogram). This tests your brain waves and stages of sleep.  An EOG (Electrooculogram). This measures the movements of your eyes. It detects periods of REM (rapid eye movement) sleep, which is your dream sleep.  An EKG (Electrocardiogram). This measures your heart rhythm.  EMG (Electromyography). This is a measurement of how the muscles are working in your upper airway and your legs while sleeping.  An oximetry measurement. It measures how much oxygen (air) you are getting while sleeping.  Breathing efforts may be measured. The same test can be interpreted (understood) differently by different caregivers and centers that study sleep.  Studies may be given an apnea/hypopnea index (AHI). This is a number which is found by counting the times of no breathing or under breathing during the night, and relating those numbers to the amount of time spent in bed. When the AHI is greater than 15, the patient is likely to complain of daytime sleepiness. When the AHI is greater than 30, the patient is at increased risk for heart problems and must be followed more closely. Following the AHI also allows you to know how treatment is working. Simple oximetry (tracking the amount of oxygen that is taken in) can be used for screening patients who:  Do not have symptoms (problems) of OSA.  Have a normal Epworth Sleepiness Scale Score.  Have a low  pre-test probability of having OSA.  Have none of the upper airway problems likely to cause apnea.  Oximetry is also used to determine if treatment is effective in patients who showed significant desaturations (not getting enough oxygen) on their home sleep study. One extra measure of safety is to perform additional studies for the person who only snores. This is because no one can predict with absolute certainty who will have OSA. Those who show significant desaturations (not getting enough oxygen) are recommended to have a more detailed sleep study. Document Released: 12/11/2002 Document Revised: 08/29/2011 Document Reviewed: 08/12/2013 [facility] Patient Information 2015 [place], [place]. This information is not intended to replace advice given to you by your health care provider. Make sure you discuss any questions you have with your health care provider.

## 2014-04-29 ENCOUNTER — Other Ambulatory Visit (HOSPITAL_COMMUNITY): Payer: Self-pay | Admitting: *Deleted

## 2014-04-29 ENCOUNTER — Ambulatory Visit (INDEPENDENT_AMBULATORY_CARE_PROVIDER_SITE_OTHER): Payer: Commercial Managed Care - HMO | Admitting: Cardiovascular Disease

## 2014-04-29 ENCOUNTER — Encounter: Payer: Self-pay | Admitting: Cardiovascular Disease

## 2014-04-29 VITALS — BP 114/80 | HR 72 | Ht 65.7 in | Wt 204.8 lb

## 2014-04-29 DIAGNOSIS — I519 Heart disease, unspecified: Secondary | ICD-10-CM

## 2014-04-29 DIAGNOSIS — I739 Peripheral vascular disease, unspecified: Secondary | ICD-10-CM

## 2014-04-29 DIAGNOSIS — I5189 Other ill-defined heart diseases: Secondary | ICD-10-CM

## 2014-04-29 DIAGNOSIS — I70219 Atherosclerosis of native arteries of extremities with intermittent claudication, unspecified extremity: Secondary | ICD-10-CM

## 2014-04-29 DIAGNOSIS — I6523 Occlusion and stenosis of bilateral carotid arteries: Secondary | ICD-10-CM

## 2014-04-29 NOTE — Patient Instructions (Signed)
Your physician has requested that you have an ankle brachial index (ABI). During this test an ultrasound and blood pressure cuff are used to evaluate the arteries that supply the arms and legs with blood. Allow thirty minutes for this exam. There are no restrictions or special instructions.  Your physician has requested that you have a carotid duplex. This test is an ultrasound of the carotid arteries in your neck. It looks at blood flow through these arteries that supply the brain with blood. Allow one hour for this exam. There are no restrictions or special instructions.  Your physician recommends that you continue on your current medications as directed. Please refer to the Current Medication list given to you today.  Your physician wants you to follow-up in: 1 year with Dr. Elease HashimotoNahser.  You will receive a reminder letter in the mail two months in advance. If you don't receive a letter, please call our office to schedule the follow-up appointment.

## 2014-04-29 NOTE — Assessment & Plan Note (Addendum)
He has a history of bilateral carotid artery stenosis. He had a carotid  duplex scan in 2011. Will reschedule him for a repeat duplex scan.  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 It was difficult to feel his pulses. We'll schedule him for bilateral ankle brachial indexes.   I will see him  again in one year.

## 2014-04-29 NOTE — Progress Notes (Signed)
Sean LabrumPeter Quinn Date of Birth  09-02-1940 [facility] [address]     [address] [place], [place]  [address] [phone]  Fax  [phone]   Problems: 1. Diastolic dysfunction 2. CVA 3. bstructive sleep apnea - did not tolerate CPAP 4. Hyperlipidemia 5,  History of Present Illness:  Sean Quinn is a 73 year old gentleman who is a previous patient of Dr. Reyes Quinn. He's been seen in the interim by Dr. Deborah Quinn. He has a history of diastolic dysfunction, obesity, dyslipidemia, and a history of strokes. He's done well since his last visit. He's not had any episodes of chest pain or shortness of breath. He had a normal stress test in 2007 which revealed no ischemia and revealed normal left ventricular systolic function.  He has a history of obstructive sleep apnea but does not like to wear this weakness. Apparently he has claustrophobia and does not tolerate it very well.  He has continued to have dsypnea.  Nov. 4, 2014:    Complains of arthritis.  Not getting any exercise.  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   Unable to walk far due to arthritis.  Has back pain that radiates down to his right leg.   no CP , no dyspnea.   Nov. 10, 2015:  Sean Quinn is doing OK He has some lower abdominal pains that radiate up to his chest. - likely gas pains. Does not get any regular exercise.  Complains of bilateral calf pain with walking. Also has pain with lying down.    Current Outpatient Prescriptions  Medication Sig Dispense Refill  . aspirin 81 MG tablet Take 81 mg by mouth daily.      Marland Kitchen. atenolol (TENORMIN) 25 MG tablet Take 25 mg by mouth daily.      . Cholecalciferol (VITAMIN D3) 1000 UNITS CAPS Take by mouth daily.      . Cyanocobalamin (VITAMIN B-12 PO) Take 500 mcg by mouth.      . fish oil-omega-3 fatty acids 1000 MG capsule Take 2 g by mouth daily.      . furosemide (LASIX) 20 MG tablet TAKE 1 TABLET BY MOUTH DAILY. 30 tablet 10  . lactulose (CHRONULAC) 10 GM/15ML  solution Take 20 g by mouth at bedtime.      . Multiple Vitamins-Minerals ([employer] SPECTRAVITE PO) Take by mouth.      Marland Kitchen. omeprazole (PRILOSEC) 20 MG capsule Take 20 mg by mouth daily.      . traMADol (ULTRAM) 50 MG tablet Take 50 mg by mouth every 6 (six) hours as needed.       No current facility-administered medications for this visit.    No Known Allergies  Past Medical History  Diagnosis Date  . Chest discomfort   . History of TIAs   . Hyperlipidemia   . Obstructive sleep apnea   . Hypertension     Past Surgical History  Procedure Laterality Date  . Shoulder surgery      X2  . Back surgery      X3  . [place] echocardiography  04/10/2008    EF 55-60%  . Cardiovascular stress test      EF 75%  . Eye surgery Right     Metal in eye  . Throat surgery      nasal and throat surgery for snoring / apnea    History  Smoking status  . Former Smoker  . Quit date: 01/03/1981  Smokeless tobacco  . Not on file    History  Alcohol Use  . Yes    Comment: 5-6 beers daily     Family History  Problem Relation Age of Onset  . Heart attack Mother   . Heart failure Father   . Diabetes Brother     Reviw of Systems:  Reviewed in the HPI.  All other systems are negative.  Physical Exam: BP 114/80 mmHg  Pulse 72  Ht 5' 5.7" (1.669 m)  Wt 204 lb 12.8 oz (92.897 kg)  BMI 33.35 kg/m2 The patient is alert and oriented x 3.  The mood and affect are normal.   Skin: warm and dry.  Color is normal.    HEENT:  He is blind in his right eye..  The mucous membranes are moist.  The carotids are 2+ without bruits.  There is no thyromegaly.  There is no JVD.    Lungs: clear.  The chest wall is non tender.    Heart: regular rate with a normal S1 and S2.  There are no murmurs, gallops, or rubs. The PMI is not displaced.     Abdomen: moderately obese abdomen.  good bowel sounds.  There is no guarding or rebound.  There is no hepatosplenomegaly or tenderness.  There are no masses.    Extremities:  no clubbing, cyanosis, or edema.  The legs are without rashes.  The distal pulses are intact.   Neuro:  Cranial nerves II - XII are intact.  Motor and sensory functions are intact.    The gait is normal.  ECG: Nov. 10, 2015:  NSR at 72.  Normal ecg    Assessment / Plan:

## 2014-04-29 NOTE — Assessment & Plan Note (Addendum)
His breathing has  remained stable.

## 2014-05-02 ENCOUNTER — Ambulatory Visit (HOSPITAL_COMMUNITY): Payer: Medicare PPO | Attending: Cardiovascular Disease | Admitting: *Deleted

## 2014-05-02 ENCOUNTER — Ambulatory Visit (HOSPITAL_BASED_OUTPATIENT_CLINIC_OR_DEPARTMENT_OTHER): Payer: Medicare PPO | Admitting: *Deleted

## 2014-05-02 DIAGNOSIS — Z8673 Personal history of transient ischemic attack (TIA), and cerebral infarction without residual deficits: Secondary | ICD-10-CM | POA: Insufficient documentation

## 2014-05-02 DIAGNOSIS — I70219 Atherosclerosis of native arteries of extremities with intermittent claudication, unspecified extremity: Secondary | ICD-10-CM

## 2014-05-02 DIAGNOSIS — E785 Hyperlipidemia, unspecified: Secondary | ICD-10-CM | POA: Insufficient documentation

## 2014-05-02 DIAGNOSIS — Z87891 Personal history of nicotine dependence: Secondary | ICD-10-CM | POA: Diagnosis not present

## 2014-05-02 DIAGNOSIS — I10 Essential (primary) hypertension: Secondary | ICD-10-CM | POA: Insufficient documentation

## 2014-05-02 DIAGNOSIS — I739 Peripheral vascular disease, unspecified: Secondary | ICD-10-CM

## 2014-05-02 DIAGNOSIS — I6523 Occlusion and stenosis of bilateral carotid arteries: Secondary | ICD-10-CM

## 2014-05-02 NOTE — Progress Notes (Signed)
Carotid duplex complete 

## 2014-05-02 NOTE — Progress Notes (Signed)
Arterial lower Doppler complete.

## 2014-05-07 ENCOUNTER — Telehealth: Payer: Self-pay | Admitting: Cardiovascular Disease

## 2014-05-07 NOTE — Telephone Encounter (Signed)
Follow  Up     Returning call back to nurse.

## 2014-05-07 NOTE — Telephone Encounter (Signed)
Reviewed results of ABIs and carotid doppler with patient who verbalized understanding.  Patient questioned cause of leg pain and I advised him to f/u with his PCP; patient reports history of back pain and arthritis and I advised that this might be cause of leg pain.

## 2014-05-19 ENCOUNTER — Ambulatory Visit (INDEPENDENT_AMBULATORY_CARE_PROVIDER_SITE_OTHER): Payer: Self-pay | Admitting: Neurology

## 2014-05-19 DIAGNOSIS — Z0289 Encounter for other administrative examinations: Secondary | ICD-10-CM

## 2014-05-19 DIAGNOSIS — Z87891 Personal history of nicotine dependence: Secondary | ICD-10-CM

## 2014-05-19 DIAGNOSIS — R0602 Shortness of breath: Secondary | ICD-10-CM

## 2014-05-19 DIAGNOSIS — R0601 Orthopnea: Secondary | ICD-10-CM

## 2014-05-19 DIAGNOSIS — G4733 Obstructive sleep apnea (adult) (pediatric): Secondary | ICD-10-CM

## 2014-05-19 NOTE — Sleep Study (Signed)
Please see the scanned sleep study interpretation located in the Procedure tab within the Chart Review section. 

## 2014-05-20 ENCOUNTER — Telehealth: Payer: Self-pay | Admitting: *Deleted

## 2014-05-20 NOTE — Telephone Encounter (Signed)
Called patient's wife and offered to do a Home Sleep Study.  The patient's insurance covers at 100% less a 45.00 co-pay.  She was to speak with her husband and call us back.  Schedule on a Monday or Tuesday.

## 2014-05-20 NOTE — Telephone Encounter (Signed)
Patient's wife called concerned that her husband was not able to complete his sleep study.  On the appointment date 05/19/2014 following the patient being hooked up to the monitoring equipment he became anxious and hyper and was not willing to stay for the study.  The patient's wife stated this is the 3rd time this has happened.  We will explore if he qualifies for a HST.  Dr. Eloise HarmanPaterson was informed of the study outcome.

## 2014-08-18 ENCOUNTER — Encounter (HOSPITAL_COMMUNITY): Payer: Self-pay | Admitting: Emergency Medicine

## 2014-08-18 ENCOUNTER — Emergency Department (HOSPITAL_COMMUNITY)
Admission: EM | Admit: 2014-08-18 | Discharge: 2014-08-19 | Disposition: E | Payer: PPO | Attending: Emergency Medicine | Admitting: Emergency Medicine

## 2014-08-18 DIAGNOSIS — Z8673 Personal history of transient ischemic attack (TIA), and cerebral infarction without residual deficits: Secondary | ICD-10-CM | POA: Insufficient documentation

## 2014-08-18 DIAGNOSIS — Z8669 Personal history of other diseases of the nervous system and sense organs: Secondary | ICD-10-CM | POA: Diagnosis not present

## 2014-08-18 DIAGNOSIS — Z87891 Personal history of nicotine dependence: Secondary | ICD-10-CM | POA: Insufficient documentation

## 2014-08-18 DIAGNOSIS — E785 Hyperlipidemia, unspecified: Secondary | ICD-10-CM | POA: Diagnosis not present

## 2014-08-18 DIAGNOSIS — R079 Chest pain, unspecified: Secondary | ICD-10-CM | POA: Diagnosis present

## 2014-08-18 DIAGNOSIS — I469 Cardiac arrest, cause unspecified: Secondary | ICD-10-CM | POA: Diagnosis not present

## 2014-08-18 DIAGNOSIS — Z79899 Other long term (current) drug therapy: Secondary | ICD-10-CM | POA: Insufficient documentation

## 2014-08-18 DIAGNOSIS — E669 Obesity, unspecified: Secondary | ICD-10-CM | POA: Diagnosis not present

## 2014-08-18 DIAGNOSIS — I10 Essential (primary) hypertension: Secondary | ICD-10-CM | POA: Diagnosis not present

## 2014-08-18 LAB — I-STAT VENOUS BLOOD GAS, ED
Acid-Base Excess: 29 mmol/L — ABNORMAL HIGH (ref 0.0–2.0)
BICARBONATE: 55.8 meq/L — AB (ref 20.0–24.0)
O2 Saturation: 100 %
PCO2 VEN: 68.8 mmHg — AB (ref 45.0–50.0)
PH VEN: 7.517 — AB (ref 7.250–7.300)
pO2, Ven: 162 mmHg — ABNORMAL HIGH (ref 30.0–45.0)

## 2014-08-18 LAB — COMPREHENSIVE METABOLIC PANEL
ALT: 42 U/L (ref 0–53)
AST: 122 U/L — AB (ref 0–37)
Albumin: 3.9 g/dL (ref 3.5–5.2)
Alkaline Phosphatase: 59 U/L (ref 39–117)
Anion gap: 8 (ref 5–15)
BILIRUBIN TOTAL: 1.1 mg/dL (ref 0.3–1.2)
BUN: 11 mg/dL (ref 6–23)
CHLORIDE: 102 mmol/L (ref 96–112)
CO2: 27 mmol/L (ref 19–32)
Calcium: 9.7 mg/dL (ref 8.4–10.5)
Creatinine, Ser: 1.02 mg/dL (ref 0.50–1.35)
GFR calc non Af Amer: 70 mL/min — ABNORMAL LOW (ref >90)
GFR, EST AFRICAN AMERICAN: 82 mL/min — AB (ref >90)
Glucose, Bld: 140 mg/dL — ABNORMAL HIGH (ref 70–99)
Potassium: 4.2 mmol/L (ref 3.5–5.1)
Sodium: 137 mmol/L (ref 135–145)
Total Protein: 7.2 g/dL (ref 6.0–8.3)

## 2014-08-18 LAB — I-STAT CHEM 8, ED
BUN: 14 mg/dL (ref 6–23)
CHLORIDE: 101 mmol/L (ref 96–112)
Calcium, Ion: 1.14 mmol/L (ref 1.13–1.30)
Creatinine, Ser: 1 mg/dL (ref 0.50–1.35)
GLUCOSE: 141 mg/dL — AB (ref 70–99)
HEMATOCRIT: 56 % — AB (ref 39.0–52.0)
Hemoglobin: 19 g/dL — ABNORMAL HIGH (ref 13.0–17.0)
POTASSIUM: 4 mmol/L (ref 3.5–5.1)
Sodium: 139 mmol/L (ref 135–145)
TCO2: 23 mmol/L (ref 0–100)

## 2014-08-18 LAB — CBC
HCT: 51.8 % (ref 39.0–52.0)
Hemoglobin: 18.2 g/dL — ABNORMAL HIGH (ref 13.0–17.0)
MCH: 31.2 pg (ref 26.0–34.0)
MCHC: 35.1 g/dL (ref 30.0–36.0)
MCV: 88.7 fL (ref 78.0–100.0)
Platelets: 166 10*3/uL (ref 150–400)
RBC: 5.84 MIL/uL — ABNORMAL HIGH (ref 4.22–5.81)
RDW: 12.6 % (ref 11.5–15.5)
WBC: 12.8 10*3/uL — AB (ref 4.0–10.5)

## 2014-08-18 LAB — I-STAT TROPONIN, ED: TROPONIN I, POC: 24.63 ng/mL — AB (ref 0.00–0.08)

## 2014-08-18 LAB — BRAIN NATRIURETIC PEPTIDE: B NATRIURETIC PEPTIDE 5: 161.7 pg/mL — AB (ref 0.0–100.0)

## 2014-08-18 MED ORDER — EPINEPHRINE HCL 0.1 MG/ML IJ SOSY
PREFILLED_SYRINGE | INTRAMUSCULAR | Status: AC | PRN
  Administered 2014-08-18 (×10): 0.1 mg via INTRAVENOUS

## 2014-08-18 MED ORDER — SODIUM BICARBONATE 8.4 % IV SOLN
INTRAVENOUS | Status: AC | PRN
  Administered 2014-08-18 (×2): 50 meq via INTRAVENOUS

## 2014-08-18 MED ORDER — AMIODARONE HCL 150 MG/3ML IV SOLN
300.0000 mg | INTRAVENOUS | Status: AC | PRN
  Administered 2014-08-18: 150 mg via INTRAVENOUS
  Administered 2014-08-18: 300 mg via INTRAVENOUS

## 2014-08-18 MED ORDER — ATROPINE SULFATE 1 MG/ML IJ SOLN
INTRAMUSCULAR | Status: AC | PRN
  Administered 2014-08-18 (×2): 1 mg via INTRAVENOUS

## 2014-08-18 MED FILL — Medication: Qty: 1 | Status: AC

## 2014-08-19 NOTE — Code Documentation (Signed)
16 Fr OG tube placed.

## 2014-08-19 NOTE — Code Documentation (Signed)
Family at beside. Family given emotional support by chaplain.  

## 2014-08-19 NOTE — ED Notes (Signed)
Cardiology at bedside.

## 2014-08-19 NOTE — ED Notes (Addendum)
Patients yellow wedding ring removed and given to the Daughter.

## 2014-08-19 NOTE — ED Notes (Signed)
Pt c/o generalized CP with radiation to both arms starting yesterday; pt denies CP more than norm; pt sts trying to stop taking omeprazole at present

## 2014-08-19 NOTE — ED Notes (Signed)
Pt belongings in detail at bedside (family at bedside): Dentures (upper and lower), glasses, silver colored watch, black cell phone in camp case, hat, plaid shirt, green shirt, blue pants with suspenders, black wallet with cash in pocket, black shoes, black jacket.

## 2014-08-19 NOTE — ED Notes (Signed)
Troponin results given to Dr. Ray 

## 2014-08-19 NOTE — ED Notes (Signed)
Pt states he feels like he is going to be sick. Pt becomes very pale and diaphoretic and slumps to the side of the bed. Pt noted to be unresponsive. EDP notified immediately and reports to bedside.

## 2014-08-19 NOTE — Code Documentation (Signed)
Cardiology at bedside. Dr. Rosalia Hammersay examining heart motion on ultrasound. No motion noted on ultrasound.

## 2014-08-19 NOTE — ED Provider Notes (Signed)
CSN: [id number]     Arrival date & time 12/26/2014  0804 History   First MD Initiated Contact with Patient 007/01/2015 [phone]     Chief Complaint  Patient presents with  . Chest Pain   Level 5 caveat- urgent need to intervene  (Consider location/radiation/quality/duration/timing/severity/associated sxs/prior Treatment) HPI This is a 74 year old man who presents today complaining of chest pain that began yesterday afternoon. It has been present mostly through the night. He states he was uncomfortable throughout the night. He states he feels like it was gas in nature. This pain is a 5 out of 10. He had an aspirin prior to coming to the hospital. He has had no other intervention. He has no history of similar symptoms in the past. He denies dyspnea states he does get some night sweats. Past Medical History  Diagnosis Date  . Chest discomfort   . History of TIAs   . Hyperlipidemia   . Obstructive sleep apnea   . Hypertension    Past Surgical History  Procedure Laterality Date  . Shoulder surgery      X2  . Back surgery      X3  . [place] echocardiography  04/10/2008    EF 55-60%  . Cardiovascular stress test      EF 75%  . Eye surgery Right     Metal in eye  . Throat surgery      nasal and throat surgery for snoring / apnea   Family History  Problem Relation Age of Onset  . Heart attack Mother   . Heart failure Father   . Diabetes Brother    History  Substance Use Topics  . Smoking status: Former Smoker    Quit date: 01/03/1981  . Smokeless tobacco: Not on file  . Alcohol Use: Yes     Comment: 5-6 beers daily     Review of Systems  Unable to perform ROS     Allergies  Review of patient's allergies indicates no known allergies.  Home Medications   Prior to Admission medications   Medication Sig Start Date End Date Taking? Authorizing Provider  aspirin 81 MG tablet Take 81 mg by mouth daily.      Historical Provider, MD  atenolol (TENORMIN) 25 MG tablet Take 25 mg by  mouth daily.      Historical Provider, MD  Cholecalciferol (VITAMIN D3) 1000 UNITS CAPS Take by mouth daily.      Historical Provider, MD  Cyanocobalamin (VITAMIN B-12 PO) Take 500 mcg by mouth.      Historical Provider, MD  fish oil-omega-3 fatty acids 1000 MG capsule Take 2 g by mouth daily.      Historical Provider, MD  furosemide (LASIX) 20 MG tablet TAKE 1 TABLET BY MOUTH DAILY. 05/20/13   Vesta MixerPhilip J Nahser, MD  lactulose (CHRONULAC) 10 GM/15ML solution Take 20 g by mouth at bedtime.      Historical Provider, MD  Multiple Vitamins-Minerals ([employer]S SPECTRAVITE PO) Take by mouth.      Historical Provider, MD  omeprazole (PRILOSEC) 20 MG capsule Take 20 mg by mouth daily.      Historical Provider, MD  traMADol (ULTRAM) 50 MG tablet Take 50 mg by mouth every 6 (six) hours as needed.      Historical Provider, MD   BP 159/98 mmHg  Pulse 100  Temp(Src) 98.3 F (36.8 C) (Oral)  Resp 21  SpO2 91% Physical Exam  Constitutional: He is oriented to person, place, and time. He appears well-developed  and well-nourished.  Obese  HENT:  Head: Normocephalic and atraumatic.  Right Ear: External ear normal.  Left Ear: External ear normal.  Nose: Nose normal.  Mouth/Throat: Oropharynx is clear and moist.  Eyes:  Right eye opaque left pupil mid  Neck: Normal range of motion. Neck supple.  Cardiovascular: Normal rate and regular rhythm.   Pulmonary/Chest: Effort normal and breath sounds normal.  Abdominal: Soft. Bowel sounds are normal.  Musculoskeletal: Normal range of motion.  Trace peripheral edema no lateralized swelling or evidence of clots  Neurological: He is alert and oriented to person, place, and time. He has normal reflexes.  Skin: Skin is warm and dry.  Psychiatric: He has a normal mood and affect.  Nursing note and vitals reviewed.   ED Course  INTUBATION Date/Time: 2014-09-03 8:35 AM Performed by: Hilario Quarry Authorized by: Hilario Quarry Consent: The procedure was performed in  an emergent situation. Indications: respiratory failure Intubation method: fiberoptic oral Patient status: unconscious Preoxygenation: BVM Tube size: 8.0 mm Cricoid pressure: yes Cords visualized: yes Post-procedure assessment: ETCO2 monitor and chest rise Breath sounds: equal and absent over the epigastrium Cuff inflated: yes ETT to lip: 24 cm Tube secured with: ETT holder Patient tolerance: Patient tolerated the procedure well with no immediate complications  ARTERIAL BLOOD GAS Date/Time: September 03, 2014 9:27 AM Performed by: Hilario Quarry Authorized by: Hilario Quarry Consent: The procedure was performed in an emergent situation. Verbal consent not obtained. Location: right femoral Number of attempts: 1 Manual pressure: manual pressure applied for more than 5 minutes   (including critical care time) Labs Review Labs Reviewed  CBC - Abnormal; Notable for the following:    WBC 12.8 (*)    RBC 5.84 (*)    Hemoglobin 18.2 (*)    All other components within normal limits  COMPREHENSIVE METABOLIC PANEL - Abnormal; Notable for the following:    Glucose, Bld 140 (*)    AST 122 (*)    GFR calc non Af Amer 70 (*)    GFR calc Af Amer 82 (*)    All other components within normal limits  I-STAT TROPOININ, ED - Abnormal; Notable for the following:    Troponin i, poc 24.63 (*)    All other components within normal limits  I-STAT CHEM 8, ED - Abnormal; Notable for the following:    Glucose, Bld 141 (*)    Hemoglobin 19.0 (*)    HCT 56.0 (*)    All other components within normal limits  BRAIN NATRIURETIC PEPTIDE    Imaging Review No results found.   EKG Interpretation   Date/Time:  Monday Sep 03, 2014 08:08:15 EST Ventricular Rate:  100 PR Interval:  172 QRS Duration: 84 QT Interval:  340 QTC Calculation: 438 R Axis:   30 Text Interpretation:  Normal sinus rhythm Non-specific ST-t changes  Diffuse Confirmed by Shavar Gorka MD, Americus Perkey ([id number]) on 09/03/14 8:20:19 AM      Concerning for st elevation in v1,2, avr and avl   Immediately after my initial evaluation, patient became unresponsive. Patient with out pulses and CPR started. Please see complete CPR log. Patient intubated. Glide scope. See intubation note.  Patient had prolonged CPR with multiple rounds of epinephrine given. Atropine given 2. He had an episode of V. tach and was defibrillated and had a short return pulses. He then became pulseless for the remainder of the CPR. Total CPR time was 827 07/29/2009 for a total of 44 minutes   cardiology was at  bedside. Bedside cardiac ultrasound performed showed no wall movement at the end of CPR.    Final diagnoses:  Cardiac arrest    I discussed the patient status intermittently through the CPR with his daughter-in-law. After CPR was stopped I discussed the case with both the patient daughter and daughter-in-law. Patient's primary care doctor is Dr. Jarold Motto and we will contact them.  Discussed with Dr. Eloise Harman.   Hilario Quarry, MD 09-03-2014 1005

## 2014-08-19 NOTE — Progress Notes (Signed)
Chaplain responded to code blue and death for pt. Chaplain worked in tandem with Home DepotChaplain Ray Watlington on case. Chaplain offered hospitality, emotional support, and facilitated communication between medical team and pt family. Pt pastor arrived and is providing care for pt family's spiritual needs. Chaplain oriented family to new patient location in [facility] Page chaplain as needed.    2014/11/09 1100  Clinical Encounter Type  Visited With Family  Visit Type Death  Referral From Chaplain  [facility]  Spiritual Encounters  Spiritual Needs Emotional  Stress Factors  Family Stress Factors Loss  Zabdi Mis, Mayer MaskerCourtney F, Chaplain 03/24/15 11:28 AM

## 2014-08-19 DEATH — deceased

## 2015-05-20 ENCOUNTER — Encounter: Payer: Self-pay | Admitting: Cardiology
